# Patient Record
Sex: Male | Born: 1970 | Race: Black or African American | Hispanic: No | Marital: Married | Smoking: Never smoker
Health system: Southern US, Community
[De-identification: ages and names within clinical notes are randomized; demographics above are authoritative.]

## PROBLEM LIST (undated history)

## (undated) DIAGNOSIS — I10 Essential (primary) hypertension: Secondary | ICD-10-CM

## (undated) DIAGNOSIS — E669 Obesity, unspecified: Secondary | ICD-10-CM

## (undated) HISTORY — PX: TOOTH EXTRACTION: SUR596

---

## 1998-06-10 ENCOUNTER — Emergency Department (HOSPITAL_COMMUNITY): Admission: EM | Admit: 1998-06-10 | Discharge: 1998-06-10 | Payer: Self-pay

## 1998-06-16 ENCOUNTER — Emergency Department (HOSPITAL_COMMUNITY): Admission: EM | Admit: 1998-06-16 | Discharge: 1998-06-16 | Payer: Self-pay | Admitting: Emergency Medicine

## 1999-07-01 ENCOUNTER — Emergency Department (HOSPITAL_COMMUNITY): Admission: EM | Admit: 1999-07-01 | Discharge: 1999-07-01 | Payer: Self-pay | Admitting: Emergency Medicine

## 1999-10-31 ENCOUNTER — Emergency Department (HOSPITAL_COMMUNITY): Admission: EM | Admit: 1999-10-31 | Discharge: 1999-11-01 | Payer: Self-pay

## 1999-11-01 ENCOUNTER — Encounter: Payer: Self-pay | Admitting: Emergency Medicine

## 2001-02-17 ENCOUNTER — Emergency Department (HOSPITAL_COMMUNITY): Admission: EM | Admit: 2001-02-17 | Discharge: 2001-02-17 | Payer: Self-pay | Admitting: Emergency Medicine

## 2001-02-25 ENCOUNTER — Emergency Department (HOSPITAL_COMMUNITY): Admission: EM | Admit: 2001-02-25 | Discharge: 2001-02-26 | Payer: Self-pay | Admitting: Emergency Medicine

## 2001-06-29 ENCOUNTER — Emergency Department (HOSPITAL_COMMUNITY): Admission: EM | Admit: 2001-06-29 | Discharge: 2001-06-29 | Payer: Self-pay

## 2001-10-15 ENCOUNTER — Emergency Department (HOSPITAL_COMMUNITY): Admission: EM | Admit: 2001-10-15 | Discharge: 2001-10-15 | Payer: Self-pay | Admitting: Emergency Medicine

## 2003-10-31 ENCOUNTER — Emergency Department (HOSPITAL_COMMUNITY): Admission: EM | Admit: 2003-10-31 | Discharge: 2003-10-31 | Payer: Self-pay | Admitting: Emergency Medicine

## 2004-05-17 ENCOUNTER — Emergency Department (HOSPITAL_COMMUNITY): Admission: EM | Admit: 2004-05-17 | Discharge: 2004-05-17 | Payer: Self-pay | Admitting: *Deleted

## 2005-03-30 ENCOUNTER — Ambulatory Visit: Payer: Self-pay | Admitting: Gastroenterology

## 2005-03-31 ENCOUNTER — Ambulatory Visit: Payer: Self-pay | Admitting: Gastroenterology

## 2006-12-29 ENCOUNTER — Emergency Department (HOSPITAL_COMMUNITY): Admission: EM | Admit: 2006-12-29 | Discharge: 2006-12-29 | Payer: Self-pay | Admitting: Emergency Medicine

## 2007-08-15 ENCOUNTER — Emergency Department (HOSPITAL_COMMUNITY): Admission: EM | Admit: 2007-08-15 | Discharge: 2007-08-15 | Payer: Self-pay | Admitting: Emergency Medicine

## 2008-07-19 ENCOUNTER — Observation Stay (HOSPITAL_COMMUNITY): Admission: EM | Admit: 2008-07-19 | Discharge: 2008-07-20 | Payer: Self-pay | Admitting: Family Medicine

## 2008-08-06 ENCOUNTER — Observation Stay (HOSPITAL_COMMUNITY): Admission: EM | Admit: 2008-08-06 | Discharge: 2008-08-07 | Payer: Self-pay | Admitting: Emergency Medicine

## 2010-07-30 ENCOUNTER — Other Ambulatory Visit: Payer: Self-pay | Admitting: Family Medicine

## 2010-08-04 ENCOUNTER — Ambulatory Visit
Admission: RE | Admit: 2010-08-04 | Discharge: 2010-08-04 | Disposition: A | Discharge status: Home / Self Care | Payer: No Typology Code available for payment source | Source: Ambulatory Visit | Attending: Family Medicine | Admitting: Family Medicine

## 2010-08-04 DIAGNOSIS — R519 Headache, unspecified: Secondary | ICD-10-CM

## 2010-09-17 LAB — LIPID PANEL
HDL: 34 mg/dL — ABNORMAL LOW (ref >39)
LDL Cholesterol: 99 mg/dL (ref 0–99)
Total CHOL/HDL Ratio: 4.6 RATIO
Triglycerides: 121 mg/dL (ref <150)
VLDL: 24 mg/dL (ref 0–40)

## 2010-09-17 LAB — POCT I-STAT, CHEM 8
BUN: 7 mg/dL (ref 6–23)
Chloride: 104 mEq/L (ref 96–112)
Glucose, Bld: 96 mg/dL (ref 70–99)
HCT: 45 % (ref 39.0–52.0)
Potassium: 3.8 mEq/L (ref 3.5–5.1)

## 2010-09-17 LAB — BASIC METABOLIC PANEL
BUN: 7 mg/dL (ref 6–23)
Chloride: 103 mEq/L (ref 96–112)
Creatinine, Ser: 0.84 mg/dL (ref 0.4–1.5)

## 2010-09-17 LAB — POCT CARDIAC MARKERS
CKMB, poc: <1 ng/mL — ABNORMAL LOW (ref 1.0–8.0)
Troponin i, poc: <0.05 ng/mL (ref 0.00–0.09)

## 2010-09-17 LAB — COMPREHENSIVE METABOLIC PANEL
ALT: 17 U/L (ref 0–53)
AST: 21 U/L (ref 0–37)
Albumin: 3.8 g/dL (ref 3.5–5.2)
Alkaline Phosphatase: 70 U/L (ref 39–117)
Potassium: 3.8 mEq/L (ref 3.5–5.1)
Sodium: 139 mEq/L (ref 135–145)
Total Protein: 7.2 g/dL (ref 6.0–8.3)

## 2010-09-17 LAB — DIFFERENTIAL
Basophils Relative: 1 % (ref 0–1)
Eosinophils Absolute: 0.1 10*3/uL (ref 0.0–0.7)
Monocytes Absolute: 0.4 10*3/uL (ref 0.1–1.0)
Monocytes Relative: 7 % (ref 3–12)

## 2010-09-17 LAB — CBC
MCHC: 33.6 g/dL (ref 30.0–36.0)
MCV: 80.5 fL (ref 78.0–100.0)
Platelets: 316 10*3/uL (ref 150–400)
Platelets: 318 10*3/uL (ref 150–400)
RDW: 14.8 % (ref 11.5–15.5)
RDW: 14.9 % (ref 11.5–15.5)
WBC: 6.7 10*3/uL (ref 4.0–10.5)

## 2010-09-17 LAB — HEPARIN LEVEL (UNFRACTIONATED): Heparin Unfractionated: 0.33 IU/mL (ref 0.30–0.70)

## 2010-09-17 LAB — PROTIME-INR
Prothrombin Time: 13.5 seconds (ref 11.6–15.2)
Prothrombin Time: 13.8 seconds (ref 11.6–15.2)

## 2010-09-17 LAB — CARDIAC PANEL(CRET KIN+CKTOT+MB+TROPI)
Total CK: 138 U/L (ref 7–232)
Troponin I: <0.01 ng/mL (ref 0.00–0.06)

## 2010-09-17 LAB — HEMOGLOBIN A1C: Hgb A1c MFr Bld: 5.8 % (ref 4.6–6.1)

## 2010-09-22 LAB — DIFFERENTIAL
Basophils Absolute: 0.1 10*3/uL (ref 0.0–0.1)
Basophils Relative: 2 % — ABNORMAL HIGH (ref 0–1)
Eosinophils Relative: 7 % — ABNORMAL HIGH (ref 0–5)
Monocytes Absolute: 0.5 10*3/uL (ref 0.1–1.0)
Monocytes Relative: 7 % (ref 3–12)

## 2010-09-22 LAB — BASIC METABOLIC PANEL
BUN: 9 mg/dL (ref 6–23)
Creatinine, Ser: 0.83 mg/dL (ref 0.4–1.5)
GFR calc non Af Amer: >60 mL/min (ref >60)
Potassium: 3.8 mEq/L (ref 3.5–5.1)

## 2010-09-22 LAB — POCT CARDIAC MARKERS: Troponin i, poc: <0.05 ng/mL (ref 0.00–0.09)

## 2010-09-22 LAB — COMPREHENSIVE METABOLIC PANEL
BUN: 10 mg/dL (ref 6–23)
CO2: 28 mEq/L (ref 19–32)
Chloride: 103 mEq/L (ref 96–112)
Creatinine, Ser: 0.88 mg/dL (ref 0.4–1.5)
GFR calc non Af Amer: >60 mL/min (ref >60)
Glucose, Bld: 102 mg/dL — ABNORMAL HIGH (ref 70–99)
Total Bilirubin: 0.6 mg/dL (ref 0.3–1.2)

## 2010-09-22 LAB — CARDIAC PANEL(CRET KIN+CKTOT+MB+TROPI)
CK, MB: 0.7 ng/mL (ref 0.3–4.0)
Relative Index: 0.6 (ref 0.0–2.5)
Relative Index: 0.6 (ref 0.0–2.5)
Relative Index: 0.6 (ref 0.0–2.5)
Troponin I: <0.01 ng/mL (ref 0.00–0.06)

## 2010-09-22 LAB — CBC
HCT: 44 % (ref 39.0–52.0)
Hemoglobin: 14.7 g/dL (ref 13.0–17.0)
MCHC: 33.5 g/dL (ref 30.0–36.0)
MCV: 81.4 fL (ref 78.0–100.0)
RDW: 14.1 % (ref 11.5–15.5)

## 2010-09-22 LAB — D-DIMER, QUANTITATIVE: D-Dimer, Quant: <0.22 ug/mL-FEU (ref 0.00–0.48)

## 2010-09-22 LAB — LIPID PANEL: Triglycerides: 124 mg/dL (ref <150)

## 2010-09-22 LAB — TSH: TSH: 0.742 u[IU]/mL (ref 0.350–4.500)

## 2010-09-22 LAB — LIPASE, BLOOD: Lipase: 24 U/L (ref 11–59)

## 2010-10-20 NOTE — Cardiovascular Report (Signed)
Alan Nguyen, Alan Nguyen              ACCOUNT NO.:  [id number]   MEDICAL RECORD NO.:  [id number]          PATIENT TYPE:  INP   LOCATION:  2855                         FACILITY:  [facility]   PHYSICIAN:  Thereasa Solo. Little, M.D. DATE OF BIRTH:  1971-04-30   DATE OF PROCEDURE:  08/07/2008  DATE OF DISCHARGE:  08/07/2008                            CARDIAC CATHETERIZATION   This 40 year old male was admitted on August 06, 2008, with chest pain and  shortness of breath.  He had had an exercise treadmill test that was  unremarkable and that he could not finish the test.  It was a very  little value.  He had been planned to have an outpatient cath, but  because of this admission, the decision was made to proceed on with cath  today.   After obtaining informed consent, the patient was prepped and draped in  the usual sterile fashion exposing the right groin.  Following local  anesthetic with 1% Xylocaine, the Seldinger technique employed, and a 5-  [place] introducer sheath was placed in the right femoral artery and 8-  French introducer sheath in the right femoral vein.  A Swan-Ganz  catheter was then advanced through its normal route into the pulmonary  artery.  It was very difficult to advance the Swan-Ganz catheter into  the pulmonary artery.  I had to use a Swan wire.  Despite positioning it  in the pulmonary artery and using a Swan wire, the catheter continued to  loop in the IVC, and I could never advance the catheter into the wedge  position.  I pulled the Swan-Ganz catheter back into the right  ventricle, tried to straighten out the loop, and then was at that point  unable to advance the catheter across the pulmonic valve again.  At that  point, the right heart catheterization was terminated after hemodynamics  were undertaken.   Coronary arteriography and ventriculography was also performed.   COMPLICATIONS:  None.   TOTAL CONTRAST:  75 mL.   EQUIPMENT:  5-French Judkins configuration  catheters.   RESULTS:  1. Right atrial pressure 12, right ventricular pressure 25/5,      pulmonary artery pressure 20/9.  Central aortic pressure was 128/83      and LV pressure was 131/9 with no significant gradient noted at the      time of pullback.  2. Ventriculography.  Ventriculography in the RAO projection done at      the end of the procedure using 25 mL of contrast at 12 mL per      second was associated with marked ventricular ectopy.  His ejection      fraction was grossly normal.  His end-diastolic pressure was 16.  3. Coronary arteriography:  There was no calcification seen in the      distribution of the coronary arteries on fluoroscopy.      a.     Normal.  It trifurcated.      b.     Circumflex.  The circumflex gave rise to 2 OM vessels, both       of which were free of  disease as was the circumflex.      c.     Optional diagonal, medium-sized vessel free of disease.      d.     LAD.  The LAD extended down across the apex of the heart.       It was free of disease.  There was a moderate-sized first diagonal       that was also free of disease.      e.     Right coronary artery.  The right coronary was free of       disease.  The PDA itself was relatively small, but free of       disease.   CONCLUSION:  1. No occlusive coronary artery disease.  2. Normal left ventricular systolic function.  3. Normal pulmonary artery pressures.   I cannot explain his chest pain or shortness of breath from the above  cath data.  I plan to discharge him to home later today.  He can follow  up with Dr. Jacinto Halim in the office.           ______________________________  Thereasa Solo. Little, M.D.     ABL/MEDQ  D:  08/07/2008  T:  08/08/2008  Job:  [id number]

## 2010-10-20 NOTE — Discharge Summary (Signed)
Alan Nguyen, Alan Nguyen              ACCOUNT NO.:  [id number]   MEDICAL RECORD NO.:  [id number]          PATIENT TYPE:  INP   LOCATION:  2855                         FACILITY:  [facility]   PHYSICIAN:  Cristy Hilts. Jacinto Halim, MD       DATE OF BIRTH:  Jan 15, 1971   DATE OF ADMISSION:  08/06/2008  DATE OF DISCHARGE:  08/07/2008                               DISCHARGE SUMMARY   DISCHARGE DIAGNOSES:  Chest pain, noncardiac with normal coronaries, and  normal left ventricular function.   HOSPITAL COURSE:  The patient is a 40 year old male who presented on  August 06, 2008, with chest pain.  He recently had been admitted for chest  pain and MI was ruled out.  He was supposed to follow up in the office  with Dr. Jacinto Halim.  He had a Myoview at the office, but only lasted about 3  minutes on the treadmill before he had chest pain and shortness of  breath.  He was scheduled to have an outpatient catheterization on the  Friday after admission, but presented early with chest pain.  He was  admitted from the emergency room started on IV heparin and nitrates.  His labs are within normal limits, his troponins are negative.  His D-  dimer is less than 0.22.  His LDL was 93, HDL 26.  He was set up for  diagnostic catheterization, which was done by Dr. Clarene Duke on August 07, 2008.  This showed no significant coronary artery disease.  The patient  will be discharged home later on the third.   DISCHARGE MEDICATIONS:  Advil p.r.n.   LABORATORY DATA:  Creatinine 0.9, hemoglobin 15.3, hematocrit 45, D-  dimer is less than 0.22, cholesterol is 144, HDL 26, LDL 93, tiopronin  is negative.  CK and MB are negative.  EKG shows sinus rhythm without  acute changes.   DISPOSITION:  The patient is discharged in stable condition and he will  follow up with Dr. Jacinto Halim as an outpatient.    Alan Nguyen has been instructed not to return to work until at least  Monday, August 12, 2008.  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      [name],  P.A.      Cristy Hilts. Jacinto Halim, MD  Electronically Signed    LKK/MEDQ  D:  08/07/2008  T:  08/08/2008  Job:  [id number]

## 2010-10-20 NOTE — H&P (Signed)
NAMEPRUDENCIO, VELAZCO NO.:  [id number]   MEDICAL RECORD NO.:  [id number]          PATIENT TYPE:  INP   LOCATION:  3735                         FACILITY:  [facility]   PHYSICIAN:  Eduard Clos, MDDATE OF BIRTH:  [birth year]   DATE OF ADMISSION:  07/18/2008  DATE OF DISCHARGE:                              HISTORY & PHYSICAL   PRIMARY CARE PHYSICIAN:  L. Lupe Carney, M.D.   CHIEF COMPLAINT:  Chest pain.   HISTORY OF PRESENT ILLNESS:  A 40 year old male with no significant past  medical history who presented to ER complaining of increasing chest pain  over the last 3 days.  The patient stated the chest pain is pressure  like, nonradiating, retrosternal, constant, sometimes increased by  exertion.  Denies any associated palpitation, nausea, diaphoresis or  shortness of breath.  The patient has been admitted for further  observation.  So far cardiac enzymes have been negative.  Presently, the  patient stated the chest pain is largely improved after getting  nitroglycerin sublingual.  The patient denies any dizziness, loss of  consciousness, weakness of limbs, abdominal pain, nausea, vomiting,  diarrhea, dysuria, discharge, fever or chills or headache.   PAST MEDICAL HISTORY:  Nothing significant.   PAST SURGICAL HISTORY:  None.   MEDICATIONS ON ADMISSION:  None.   ALLERGIES:  No known drug allergies.   SOCIAL HISTORY:  The patient denies smoking cigarettes, drinks alcohol  occasionally.  Denies any illegal drug abuse.   FAMILY HISTORY:  Not significant for any coronary disease or stroke or  diabetes.   REVIEW OF SYSTEMS:  As presented in the history of present illness.  Nothing else significant.   PHYSICAL EXAMINATION:  Patient examined at bedside, not in acute  distress.  VITAL SIGNS:  Blood pressure is 97/59, pulse 60 per minute, temperature  97.8, respirations 18 per minute, O2 sat 98%.  HEENT:  Anicteric.  No pallor.  CHEST:  Bilateral air entry  present.  No rhonchi, no crepitation.  HEART:  S1, S2 heard.  ABDOMEN:  Soft, nontender.  Bowel sounds heard.  CNS:  Alert.  Oriented to time, place and person.  Both upper and lower  extremities 5/5.  EXTREMITIES:  Peripheral pulses felt.  No edema.   LABS:  EKG:  Normal sinus rhythm with nonspecific T-wave changes.  Chest  x-ray:  No active lung disease, mild peribronchial thickening.  CBC:  WBC is 6.2, hemoglobin 14.7, hematocrit 44, platelets 329, D-dimer less  than 0.2.  Complete metabolic panel:  Sodium 136, potassium 3.7,  chloride 103, carbon dioxide 28, glucose 102, BUN 10, creatinine 0.8,  total bilirubin 0.6, alkaline phosphatase 64, AST 16, ALT 17, total  protein 7.5, albumin 3.8, calcium 9, lipase 24, troponin I less than  0.01 x2, CK 123 and 119, myoglobin 49.8 and 47.   ASSESSMENT:  Chest pain, rule out acute coronary syndrome.   PLAN:  To admit the patient to telemetry for observation.  Will cycle  one more set of cardiac enzymes.  Will get a cardiological opinion and  we will follow the recommendations.  Eduard Clos, MD  Electronically Signed     ANK/MEDQ  D:  07/19/2008  T:  07/19/2008  Job:  [id number]

## 2010-10-20 NOTE — Consult Note (Signed)
Alan Nguyen, Alan Nguyen              ACCOUNT NO.:  [id number]   MEDICAL RECORD NO.:  [id number]          PATIENT TYPE:  INP   LOCATION:  3735                         FACILITY:  [facility]   PHYSICIAN:  Cristy Hilts. Jacinto Halim, MD       DATE OF BIRTH:  11/14/1970   DATE OF CONSULTATION:  07/19/2008  DATE OF DISCHARGE:                                 CONSULTATION   CHIEF COMPLAINT:  Chest pain and shortness of breath.   HISTORY OF PRESENT ILLNESS:  Mr. Levario is a 40 year old male who was  admitted by the [facility] on July 19, 2008 with chest pain,  shortness of breath.  The patient says he has had constant left-sided  chest pain with some dyspnea on exertion since Tuesday.  He describes it  as someone sitting on my chest.  It does not radiate to his arm or  jaws.  He does feel like it is worse when he walks, it is also tender.  He has never had chest pain like this before.  He did take some Tylenol  at home with partial relief.   CURRENT MEDICATIONS:  None.   PAST MEDICAL HISTORY:  Is unremarkable for serious medical problems, he  denies hypertension, diabetes or dyslipidemia.  He has not had major  surgeries.  He has no known drug allergies.   SOCIAL HISTORY:  He is separated, he works at [employer].  He does lift heavy  boxes, but he has not done this any more than usual recently.  He denies  drug use.   FAMILY HISTORY:  Unremarkable for coronary disease or diabetes.   REVIEW OF SYSTEMS:  Essentially unremarkable except for as noted above.  He denies any cough or fever.  He has not had lower extremity edema.   PHYSICAL EXAM:  97/59, pulse 60, temp 97.8, O2 sat 98 on 2 liters.   Troponins are negative x3.  Sodium 136, potassium 3.7, BUN 10,  creatinine 0.88.  LFTs were normal.  White count 6.2, hemoglobin 14.7,  hematocrit 44, platelets 329.  Chest x-ray shows no active lung disease.  EKG shows sinus rhythm without acute changes, sinus bradycardia.  D-  dimer was normal at 0.22 or  less.   IMPRESSION:  Chest pain, this sounds musculoskeletal.   PLAN:  The patient was seen by a cardiologist today.  He may need to be  set up for an outpatient stress test.      [name], P.A.      Cristy Hilts. Jacinto Halim, MD  Electronically Signed    LKK/MEDQ  D:  07/19/2008  T:  07/19/2008  Job:  [id number]

## 2011-03-01 LAB — CBC
HCT: 41.2
Hemoglobin: 13.7
RBC: 5.12
WBC: 6.4

## 2011-03-01 LAB — URINALYSIS, ROUTINE W REFLEX MICROSCOPIC
Glucose, UA: NEGATIVE
Specific Gravity, Urine: 1.021
pH: 5.5

## 2011-03-01 LAB — COMPREHENSIVE METABOLIC PANEL
ALT: 12
Alkaline Phosphatase: 62
BUN: 10
Chloride: 102
Glucose, Bld: 121 — ABNORMAL HIGH
Potassium: 3.6
Total Bilirubin: 0.6

## 2011-03-01 LAB — GC/CHLAMYDIA PROBE AMP, GENITAL: Chlamydia, DNA Probe: NEGATIVE

## 2011-03-01 LAB — DIFFERENTIAL
Basophils Absolute: 0.1
Basophils Relative: 1
Eosinophils Absolute: 0.3
Monocytes Absolute: 0.5
Neutro Abs: 3.5
Neutrophils Relative %: 55

## 2011-09-09 ENCOUNTER — Encounter (HOSPITAL_COMMUNITY): Payer: Self-pay | Admitting: Emergency Medicine

## 2011-09-09 ENCOUNTER — Emergency Department (INDEPENDENT_AMBULATORY_CARE_PROVIDER_SITE_OTHER)
Admission: EM | Admit: 2011-09-09 | Discharge: 2011-09-09 | Disposition: A | Discharge status: Home / Self Care | Payer: BC Managed Care – PPO | Source: Home / Self Care | Attending: Family Medicine | Admitting: Family Medicine

## 2011-09-09 ENCOUNTER — Telehealth (HOSPITAL_COMMUNITY): Payer: Self-pay | Admitting: *Deleted

## 2011-09-09 DIAGNOSIS — J029 Acute pharyngitis, unspecified: Secondary | ICD-10-CM

## 2011-09-09 MED ORDER — AMOXICILLIN 500 MG PO CAPS: 500.0000 mg | ORAL_CAPSULE | Freq: Three times a day (TID) | ORAL | Status: AC

## 2011-09-09 NOTE — ED Notes (Signed)
PT HERE WITH SORE THROAT THAT STARTED Monday BUT HAS WORSENED WITH DIFF SWALLOWING AND PAIN.NO FEVERS OR N/V.PT TRIED OTC THROAT SPRAY BUT NO RELIEF

## 2011-09-09 NOTE — Discharge Instructions (Signed)
Tylenol or motrin as needed. Avoid caffeine and milk products. Throat spray of choice. Pharyngitis, Viral and Bacterial Pharyngitis is soreness (inflammation) or infection of the pharynx. It is also called a sore throat. CAUSES  Most sore throats are caused by viruses and are part of a cold. However, some sore throats are caused by strep and other bacteria. Sore throats can also be caused by post nasal drip from draining sinuses, allergies and sometimes from sleeping with an open mouth. Infectious sore throats can be spread from person to person by coughing, sneezing and sharing cups or eating utensils. TREATMENT  Sore throats that are viral usually last 3-4 days. Viral illness will get better without medications (antibiotics). Strep throat and other bacterial infections will usually begin to get better about 24-48 hours after you begin to take antibiotics. HOME CARE INSTRUCTIONS   If the caregiver feels there is a bacterial infection or if there is a positive strep test, they will prescribe an antibiotic. The full course of antibiotics must be taken. If the full course of antibiotic is not taken, you or your child may become ill again. If you or your child has strep throat and do not finish all of the medication, serious heart or kidney diseases may develop.   Drink enough water and fluids to keep your urine clear or pale yellow.   Only take over-the-counter or prescription medicines for pain, discomfort or fever as directed by your caregiver.   Get lots of rest.   Gargle with salt water ( tsp. of salt in a glass of water) as often as every 1-2 hours as you need for comfort.   Hard candies may soothe the throat if individual is not at risk for choking. Throat sprays or lozenges may also be used.  SEEK MEDICAL CARE IF:   Large, tender lumps in the neck develop.   A rash develops.   Green, yellow-brown or bloody sputum is coughed up.   Your baby is older than 3 months with a rectal  temperature of 100.5 F (38.1 C) or higher for more than 1 day.  SEEK IMMEDIATE MEDICAL CARE IF:   A stiff neck develops.   You or your child are drooling or unable to swallow liquids.   You or your child are vomiting, unable to keep medications or liquids down.   You or your child has severe pain, unrelieved with recommended medications.   You or your child are having difficulty breathing (not due to stuffy nose).   You or your child are unable to fully open your mouth.   You or your child develop redness, swelling, or severe pain anywhere on the neck.   You have a fever.   Your baby is older than 3 months with a rectal temperature of 102 F (38.9 C) or higher.   Your baby is 70 months old or younger with a rectal temperature of 100.4 F (38 C) or higher.  MAKE SURE YOU:   Understand these instructions.   Will watch your condition.   Will get help right away if you are not doing well or get worse.  Document Released: 05/24/2005 Document Revised: 05/13/2011 Document Reviewed: 08/21/2007 [facility] Patient Information 2012 [place], [place].

## 2011-09-09 NOTE — ED Notes (Signed)
1617 Pt. called and said he forgot to ask for a work note. I told him Dr. Lorenza Chick has already left and I would have to ask one of the other doctors. I told him he would have to come pick it up at the front desk. He voiced understanding. Discussed with Dr. Lorenz Coaster and he said pt. can go back on Sat. 4/6. Note done as directed and put at the front desk.  I called and left message with pt.'s brother that note was ready to be picked up. Vassie Moselle 09/09/2011

## 2011-09-09 NOTE — ED Provider Notes (Signed)
History     CSN: [id number]  Arrival date & time 09/09/11  1357   First MD Initiated Contact with Patient 09/09/11 1531      Chief Complaint  Patient presents with  . Sore Throat    (Consider location/radiation/quality/duration/timing/severity/associated sxs/prior treatment) HPI Comments: The patient present with a history of sore throat x 3 days. Getting worse. Using otc thorat spray. He denies fever, runny nose, cough or congestion. Has noted some right ear pain with swallowing. No vomiting or diarrhea  The history is provided by the patient.    History reviewed. No pertinent past medical history.  Past Surgical History  Procedure Date  . Tooth extraction     History reviewed. No pertinent family history.  History  Substance Use Topics  . Smoking status: Never Smoker   . Smokeless tobacco: Not on file  . Alcohol Use: Yes      Review of Systems  Constitutional: Negative.   HENT: Positive for ear pain, sore throat and trouble swallowing. Negative for congestion, rhinorrhea, neck stiffness and postnasal drip.   Respiratory: Negative for cough, choking and shortness of breath.   Cardiovascular: Negative.   Gastrointestinal: Negative.   Genitourinary: Negative.   Musculoskeletal: Negative.     Allergies  Review of patient's allergies indicates no known allergies.  Home Medications   Current Outpatient Rx  Name Route Sig Dispense Refill  . AMOXICILLIN 500 MG PO CAPS Oral Take 1 capsule (500 mg total) by mouth 3 (three) times daily. 30 capsule 0    BP 123/74  Pulse 80  Temp(Src) 98.4 F (36.9 C) (Oral)  Resp 18  SpO2 98%  Physical Exam  Nursing note and vitals reviewed. Constitutional: He appears well-developed and well-nourished. No distress.  HENT:  Head: Normocephalic and atraumatic.       Ears clear, nose clear, throat red and swollen without exudates  Neck: Normal range of motion. Neck supple. No tracheal deviation present.  Cardiovascular:  Normal rate and regular rhythm.   Pulmonary/Chest: Effort normal and breath sounds normal.  Lymphadenopathy:    He has no cervical adenopathy.  Skin: Skin is warm and dry.    ED Course  Procedures (including critical care time)  Labs Reviewed - No data to display No results found.   1. Pharyngitis       MDM         Randa Spike, MD 09/09/11 [phone]

## 2013-04-07 ENCOUNTER — Emergency Department: Payer: Self-pay | Admitting: Emergency Medicine

## 2015-06-04 ENCOUNTER — Emergency Department
Admission: EM | Admit: 2015-06-04 | Discharge: 2015-06-04 | Disposition: A | Discharge status: Home / Self Care | Payer: Worker's Compensation | Attending: Emergency Medicine | Admitting: Emergency Medicine

## 2015-06-04 ENCOUNTER — Encounter: Payer: Self-pay | Admitting: Emergency Medicine

## 2015-06-04 DIAGNOSIS — S51011A Laceration without foreign body of right elbow, initial encounter: Secondary | ICD-10-CM | POA: Diagnosis not present

## 2015-06-04 DIAGNOSIS — W268XXA Contact with other sharp object(s), not elsewhere classified, initial encounter: Secondary | ICD-10-CM | POA: Insufficient documentation

## 2015-06-04 DIAGNOSIS — Y9289 Other specified places as the place of occurrence of the external cause: Secondary | ICD-10-CM | POA: Diagnosis not present

## 2015-06-04 DIAGNOSIS — S41111A Laceration without foreign body of right upper arm, initial encounter: Secondary | ICD-10-CM | POA: Diagnosis present

## 2015-06-04 DIAGNOSIS — Z23 Encounter for immunization: Secondary | ICD-10-CM | POA: Diagnosis not present

## 2015-06-04 DIAGNOSIS — Y990 Civilian activity done for income or pay: Secondary | ICD-10-CM | POA: Diagnosis not present

## 2015-06-04 DIAGNOSIS — Y9389 Activity, other specified: Secondary | ICD-10-CM | POA: Insufficient documentation

## 2015-06-04 MED ORDER — LIDOCAINE-EPINEPHRINE (PF) 1 %-1:200000 IJ SOLN
INTRAMUSCULAR | Status: AC
  Filled 2015-06-04: qty 30

## 2015-06-04 MED ORDER — OXYCODONE-ACETAMINOPHEN 5-325 MG PO TABS
1.0000 | ORAL_TABLET | Freq: Once | ORAL | Status: AC
  Administered 2015-06-04: 1 via ORAL
  Filled 2015-06-04: qty 1

## 2015-06-04 MED ORDER — TETANUS-DIPHTHERIA TOXOIDS TD 5-2 LFU IM INJ: 0.5000 mL | INJECTION | Freq: Once | INTRAMUSCULAR | Status: DC

## 2015-06-04 MED ORDER — TRAMADOL HCL 50 MG PO TABS: 50.0000 mg | ORAL_TABLET | Freq: Four times a day (QID) | ORAL | Status: AC | PRN

## 2015-06-04 MED ORDER — TETANUS-DIPHTH-ACELL PERTUSSIS 5-2.5-18.5 LF-MCG/0.5 IM SUSP
INTRAMUSCULAR | Status: AC
  Administered 2015-06-04: 0.5 mL via INTRAMUSCULAR
  Filled 2015-06-04: qty 0.5

## 2015-06-04 NOTE — ED Notes (Signed)
Brought in via ems from work  Laceration noted to right arm from a door panel

## 2015-06-04 NOTE — ED Provider Notes (Signed)
[facility] Emergency Department Provider Note  ____________________________________________  Time seen: Approximately 2:13 PM  I have reviewed the triage vital signs and the nursing notes.   HISTORY  Chief Complaint Extremity Laceration    HPI Alan Nguyen is a 44 y.o. male patient arrived via EMS from a job-related injury. Patient has a laceration secondary to a metal cut to the posterior right arm extending into the elbow. Hemorrhaging was finally controlled with direct pressure by EMS. Patient denies any loss of sensation or loss of function of the affected extremity. Patient state he did not remember when his last tetanus shot was given. Patient does state there is no pain. Except for her seizures and no palliative measures performed prior to arrival. Patient is right-hand dominant.  History reviewed. No pertinent past medical history.  There are no active problems to display for this patient.   Past Surgical History  Procedure Laterality Date  . Tooth extraction      Current Outpatient Rx  Name  Route  Sig  Dispense  Refill  . traMADol (ULTRAM) 50 MG tablet   Oral   Take 1 tablet (50 mg total) by mouth every 6 (six) hours as needed.   20 tablet   0     Allergies Review of patient's allergies indicates no known allergies.  No family history on file.  Social History Social History  Substance Use Topics  . Smoking status: Never Smoker   . Smokeless tobacco: None  . Alcohol Use: Yes    Review of Systems Constitutional: No fever/chills Eyes: No visual changes. ENT: No sore throat. Cardiovascular: Denies chest pain. Respiratory: Denies shortness of breath. Gastrointestinal: No abdominal pain.  No nausea, no vomiting.  No diarrhea.  No constipation. Genitourinary: Negative for dysuria. Musculoskeletal: Negative for back pain. Skin: Laceration to right arm extending to the elbow. Neurological: Negative for headaches, focal  weakness or numbness. 10-point ROS otherwise negative.  ____________________________________________   PHYSICAL EXAM:  VITAL SIGNS: ED Triage Vitals  Enc Vitals Group     BP 06/04/15 1237 137/100 mmHg     Pulse Rate 06/04/15 1237 78     Resp 06/04/15 1237 18     Temp 06/04/15 1237 98 F (36.7 C)     Temp Source 06/04/15 1237 Oral     SpO2 06/04/15 1237 98 %     Weight 06/04/15 1237 250 lb (113.399 kg)     Height 06/04/15 1237 6' (1.829 m)     Head Cir --      Peak Flow --      Pain Score 06/04/15 1235 0     Pain Loc --      Pain Edu? --      Excl. in GC? --     Constitutional: Alert and oriented. Well appearing and in no acute distress. Eyes: Conjunctivae are normal. PERRL. EOMI. Head: Atraumatic. Nose: No congestion/rhinnorhea. Mouth/Throat: Mucous membranes are moist.  Oropharynx non-erythematous. Neck: No stridor.  No cervical spine tenderness to palpation. Hematological/Lymphatic/Immunilogical: No cervical lymphadenopathy. Cardiovascular: Normal rate, regular rhythm. Grossly normal heart sounds.  Good peripheral circulation. Respiratory: Normal respiratory effort.  No retractions. Lungs CTAB. Gastrointestinal: Soft and nontender. No distention. No abdominal bruits. No CVA tenderness. Musculoskeletal: No lower extremity tenderness nor edema.  No joint effusions. Neurologic:  Normal speech and language. No gross focal neurologic deficits are appreciated. No gait instability. Skin:  Skin is warm, dry and intact. No rash noted. 5 cm laceration extending from the  lower right arm into the elbow. Copious amount of venous hemorrhaging. Psychiatric: Mood and affect are normal. Speech and behavior are normal.  ____________________________________________   LABS (all labs ordered are listed, but only abnormal results are displayed)  Labs Reviewed - No data to  display ____________________________________________  EKG   ____________________________________________  RADIOLOGY   ____________________________________________   PROCEDURES  Procedure(s) performed: None  Critical Care performed: No LACERATION REPAIR Performed by: Joni Reiningonald K Smith Authorized by: Joni Reiningonald K Smith Consent: Verbal consent obtained. Risks and benefits: risks, benefits and alternatives were discussed Consent given by: patient Patient identity confirmed: provided demographic data Prepped and Draped in normal sterile fashion Wound explored  Laceration Location: Right arm extending to the right elbow.  Laceration Length: 5 and half centimeters.  No Foreign Bodies seen or palpated  Anesthesia: local infiltration  Local anesthetic: lidocaine 1% with epinephrine. epinephrine  Anesthetic total: Well ML's. ml  Irrigation method: syringe Amount of cleaning: standard  Skin closure: We'll nylon. Number of sutures: 12.   Technique: Interrupted   Patient tolerance: Patient tolerated the procedure well with no immediate complications.  ____________________________________________   INITIAL IMPRESSION / ASSESSMENT AND PLAN / ED COURSE  Pertinent labs & imaging results that were available during my care of the patient were reviewed by me and considered in my medical decision making (see chart for details).   laceration right on the extensor right forearm. Hemorrhage and was finally controlled using Surgicel. Area was sutured, present dressing applied, and placed in a sling.. Patient advised to monitor area for any developing swelling to return back to ER if swelling worsens. Patient also advised to follow-up 20 file for wound recheck. ___________________________________________   FINAL CLINICAL IMPRESSION(S) / ED DIAGNOSES  Final diagnoses:  Laceration of right arm with complication, initial encounter      Joni ReiningRonald K Smith, PA-C 06/04/15 1422  Minna AntisKevin  Paduchowski, MD 06/04/15 [phone]

## 2015-06-04 NOTE — Discharge Instructions (Signed)
Laceration Care, Adult  A laceration is a cut that goes through all layers of the skin. The cut also goes into the tissue that is right under the skin. Some cuts heal on their own. Others need to be closed with stitches (sutures), staples, skin adhesive strips, or wound glue. Taking care of your cut lowers your risk of infection and helps your cut to heal better.  HOW TO TAKE CARE OF YOUR CUT  For stitches or staples:  · Keep the wound clean and dry.  · If you were given a bandage (dressing), you should change it at least one time per day or as told by your doctor. You should also change it if it gets wet or dirty.  · Keep the wound completely dry for the first 24 hours or as told by your doctor. After that time, you may take a shower or a bath. However, make sure that the wound is not soaked in water until after the stitches or staples have been removed.  · Clean the wound one time each day or as told by your doctor:    Wash the wound with soap and water.    Rinse the wound with water until all of the soap comes off.    Pat the wound dry with a clean towel. Do not rub the wound.  · After you clean the wound, put a thin layer of antibiotic ointment on it as told by your doctor. This ointment:    Helps to prevent infection.    Keeps the bandage from sticking to the wound.  · Have your stitches or staples removed as told by your doctor.  If your doctor used skin adhesive strips:   · Keep the wound clean and dry.  · If you were given a bandage, you should change it at least one time per day or as told by your doctor. You should also change it if it gets dirty or wet.  · Do not get the skin adhesive strips wet. You can take a shower or a bath, but be careful to keep the wound dry.  · If the wound gets wet, pat it dry with a clean towel. Do not rub the wound.  · Skin adhesive strips fall off on their own. You can trim the strips as the wound heals. Do not remove any strips that are still stuck to the wound. They will  fall off after a while.  If your doctor used wound glue:  · Try to keep your wound dry, but you may briefly wet it in the shower or bath. Do not soak the wound in water, such as by swimming.  · After you take a shower or a bath, gently pat the wound dry with a clean towel. Do not rub the wound.  · Do not do any activities that will make you really sweaty until the skin glue has fallen off on its own.  · Do not apply liquid, cream, or ointment medicine to your wound while the skin glue is still on.  · If you were given a bandage, you should change it at least one time per day or as told by your doctor. You should also change it if it gets dirty or wet.  · If a bandage is placed over the wound, do not let the tape for the bandage touch the skin glue.  · Do not pick at the glue. The skin glue usually stays on for 5-10 days. Then, it   falls off of the skin.  General Instructions   · To help prevent scarring, make sure to cover your wound with sunscreen whenever you are outside after stitches are removed, after adhesive strips are removed, or when wound glue stays in place and the wound is healed. Make sure to wear a sunscreen of at least 30 SPF.  · Take over-the-counter and prescription medicines only as told by your doctor.  · If you were given antibiotic medicine or ointment, take or apply it as told by your doctor. Do not stop using the antibiotic even if your wound is getting better.  · Do not scratch or pick at the wound.  · Keep all follow-up visits as told by your doctor. This is important.  · Check your wound every day for signs of infection. Watch for:    Redness, swelling, or pain.    Fluid, blood, or pus.  · Raise (elevate) the injured area above the level of your heart while you are sitting or lying down, if possible.  GET HELP IF:  · You got a tetanus shot and you have any of these problems at the injection site:    Swelling.    Very bad pain.    Redness.    Bleeding.  · You have a fever.  · A wound that was  closed breaks open.  · You notice a bad smell coming from your wound or your bandage.  · You notice something coming out of the wound, such as wood or glass.  · Medicine does not help your pain.  · You have more redness, swelling, or pain at the site of your wound.  · You have fluid, blood, or pus coming from your wound.  · You notice a change in the color of your skin near your wound.  · You need to change the bandage often because fluid, blood, or pus is coming from the wound.  · You start to have a new rash.  · You start to have numbness around the wound.  GET HELP RIGHT AWAY IF:  · You have very bad swelling around the wound.  · Your pain suddenly gets worse and is very bad.  · You notice painful lumps near the wound or on skin that is anywhere on your body.  · You have a red streak going away from your wound.  · The wound is on your hand or foot and you cannot move a finger or toe like you usually can.  · The wound is on your hand or foot and you notice that your fingers or toes look pale or bluish.     This information is not intended to replace advice given to you by your health care provider. Make sure you discuss any questions you have with your health care provider.     Document Released: 11/10/2007 Document Revised: 10/08/2014 Document Reviewed: 05/20/2014  Elsevier Interactive Patient Education ©2016 [employer] Inc[employer]

## 2015-06-05 ENCOUNTER — Emergency Department
Admission: EM | Admit: 2015-06-05 | Discharge: 2015-06-05 | Disposition: A | Discharge status: Home / Self Care | Payer: Self-pay | Attending: Emergency Medicine | Admitting: Emergency Medicine

## 2015-06-05 ENCOUNTER — Encounter: Payer: Self-pay | Admitting: Emergency Medicine

## 2015-06-05 DIAGNOSIS — Z5189 Encounter for other specified aftercare: Secondary | ICD-10-CM

## 2015-06-05 DIAGNOSIS — X58XXXD Exposure to other specified factors, subsequent encounter: Secondary | ICD-10-CM | POA: Insufficient documentation

## 2015-06-05 DIAGNOSIS — Z4801 Encounter for change or removal of surgical wound dressing: Secondary | ICD-10-CM | POA: Insufficient documentation

## 2015-06-05 DIAGNOSIS — S51811D Laceration without foreign body of right forearm, subsequent encounter: Secondary | ICD-10-CM | POA: Insufficient documentation

## 2015-06-05 NOTE — ED Notes (Signed)
Assessed per PA 

## 2015-06-05 NOTE — ED Provider Notes (Signed)
[facility] Emergency Department Provider Note  ____________________________________________  Time seen: Approximately 4:33 PM  I have reviewed the triage vital signs and the nursing notes.   HISTORY  Chief Complaint Wound Check    HPI Alan Nguyen is a 44 y.o. male patient here today for wound check secondary to excessive laceration to the posterior aspect of his right arm extending to the elbow. Patient denies any complaint except for mild to moderate pain with extension of the right upper extremity. Patient is rating his pain discomfort as a 7/10. Patient is wearing a sling as directed.   History reviewed. No pertinent past medical history.  There are no active problems to display for this patient.   Past Surgical History  Procedure Laterality Date  . Tooth extraction      Current Outpatient Rx  Name  Route  Sig  Dispense  Refill  . traMADol (ULTRAM) 50 MG tablet   Oral   Take 1 tablet (50 mg total) by mouth every 6 (six) hours as needed.   20 tablet   0     Allergies Review of patient's allergies indicates no known allergies.  No family history on file.  Social History Social History  Substance Use Topics  . Smoking status: Never Smoker   . Smokeless tobacco: None  . Alcohol Use: Yes    Review of Systems Constitutional: No fever/chills Eyes: No visual changes. ENT: No sore throat. Cardiovascular: Denies chest pain. Respiratory: Denies shortness of breath. Gastrointestinal: No abdominal pain.  No nausea, no vomiting.  No diarrhea.  No constipation. Genitourinary: Negative for dysuria. Musculoskeletal: Negative for back pain. Skin: Negative for rash. Right arm laceration. Neurological: Negative for headaches, focal weakness or numbness. 10-point ROS otherwise negative.  ____________________________________________   PHYSICAL EXAM:  VITAL SIGNS: ED Triage Vitals  Enc Vitals Group     BP 06/05/15 1614 143/97  mmHg     Pulse Rate 06/05/15 1614 88     Resp 06/05/15 1614 18     Temp 06/05/15 1614 97.8 F (36.6 C)     Temp Source 06/05/15 1614 Oral     SpO2 06/05/15 1614 94 %     Weight 06/05/15 1614 263 lb (119.296 kg)     Height 06/05/15 1614 6' (1.829 m)     Head Cir --      Peak Flow --      Pain Score 06/05/15 1615 7     Pain Loc --      Pain Edu? --      Excl. in GC? --     Constitutional: Alert and oriented. Well appearing and in no acute distress. Eyes: Conjunctivae are normal. PERRL. EOMI. Head: Atraumatic. Nose: No congestion/rhinnorhea. Mouth/Throat: Mucous membranes are moist.  Oropharynx non-erythematous. Neck: No stridor.  No cervical spine tenderness to palpation. Hematological/Lymphatic/Immunilogical: No cervical lymphadenopathy. Cardiovascular: Normal rate, regular rhythm. Grossly normal heart sounds.  Good peripheral circulation. Respiratory: Normal respiratory effort.  No retractions. Lungs CTAB. Gastrointestinal: Soft and nontender. No distention. No abdominal bruits. No CVA tenderness. Musculoskeletal: Increased pain with extension of the right upper extremity. Neurologic:  Normal speech and language. No gross focal neurologic deficits are appreciated. No gait instability. Skin:  Skin is warm, dry and intact. No rash noted.sutures are intact. No signs symptoms secondary infection.  Psychiatric: Mood and affect are normal. Speech and behavior are normal.  ____________________________________________   LABS (all labs ordered are listed, but only abnormal results are displayed)  Labs Reviewed -  No data to display ____________________________________________  EKG   ____________________________________________  RADIOLOGY   ____________________________________________   PROCEDURES  Procedure(s) performed: None  Critical Care performed: No  ____________________________________________   INITIAL IMPRESSION / ASSESSMENT AND PLAN / ED COURSE  Pertinent  labs & imaging results that were available during my care of the patient were reviewed by me and considered in my medical decision making (see chart for detail  laceration right arm extending into the forearm. This was removed and sutures are intact. No signs symptoms secondary infection. Patient continue to complain of pain with extension of the right upper extremity. Neurovascular intact  FINAL CLINICAL IMPRESSION(S) / ED DIAGNOSES  healing laceration to right arm   Final diagnoses:  Encounter for wound re-check      Joni Reiningonald K Zemira Zehring, PA-C 06/05/15 1641  Phineas SemenGraydon Goodman, MD 06/10/15 [phone]

## 2015-06-05 NOTE — ED Notes (Signed)
Recheck , wound to right elbow  , sling in use , drsg intact, distal pulse present, skin warm to touch ,

## 2015-06-05 NOTE — Discharge Instructions (Signed)
With sling for 3 more days. Remove the sling 3-4 times a day and go through range of motion to extend the arm. Daily dressing changes as directed. Return in 8 days for suture removal.

## 2015-06-13 ENCOUNTER — Encounter: Payer: Self-pay | Admitting: *Deleted

## 2015-06-13 ENCOUNTER — Emergency Department
Admission: EM | Admit: 2015-06-13 | Discharge: 2015-06-13 | Disposition: A | Discharge status: Home / Self Care | Payer: Worker's Compensation | Attending: Student | Admitting: Student

## 2015-06-13 DIAGNOSIS — Z4801 Encounter for change or removal of surgical wound dressing: Secondary | ICD-10-CM | POA: Insufficient documentation

## 2015-06-13 DIAGNOSIS — Z5189 Encounter for other specified aftercare: Secondary | ICD-10-CM

## 2015-06-13 NOTE — Discharge Instructions (Signed)
Return January 12th for suture removal.

## 2015-06-13 NOTE — ED Provider Notes (Signed)
[facility] Emergency Department Provider Note ____________________________________________  Time seen: Approximately 12:46 PM  I have reviewed the triage vital signs and the nursing notes.   HISTORY  Chief Complaint Suture / Staple Removal  HPI Alan Nguyen is a 45 y.o. male is here for suture removal to his right elbow. Patient was sutured on 06/05/2015. He states he has not had any problems other than his elbow is sore. He denies any known fever and has not seen any discharge or drainage.   History reviewed. No pertinent past medical history.  There are no active problems to display for this patient.   Past Surgical History  Procedure Laterality Date  . Tooth extraction      Current Outpatient Rx  Name  Route  Sig  Dispense  Refill  . traMADol (ULTRAM) 50 MG tablet   Oral   Take 1 tablet (50 mg total) by mouth every 6 (six) hours as needed.   20 tablet   0     Allergies Review of patient's allergies indicates no known allergies.  History reviewed. No pertinent family history.  Social History Social History  Substance Use Topics  . Smoking status: Never Smoker   . Smokeless tobacco: None  . Alcohol Use: Yes    Review of Systems Constitutional: No fever/chills Cardiovascular: Denies chest pain. Respiratory: Denies shortness of breath. Gastrointestinal:   No nausea, no vomiting.   Musculoskeletal: Does have right elbow pain. Skin: Negative for rash. Healing laceration right elbow. Neurological: Negative for headaches, focal weakness or numbness.  10-point ROS otherwise negative.  ____________________________________________   PHYSICAL EXAM:  VITAL SIGNS: ED Triage Vitals  Enc Vitals Group     BP 06/13/15 1242 127/99 mmHg     Pulse Rate 06/13/15 1242 74     Resp 06/13/15 1242 18     Temp 06/13/15 1242 97.9 F (36.6 C)     Temp Source 06/13/15 1242 Oral     SpO2 06/13/15 1242 94 %     Weight 06/13/15 1242 257 lb  (116.574 kg)     Height 06/13/15 1242 6' (1.829 m)     Head Cir --      Peak Flow --      Pain Score 06/13/15 1241 5     Pain Loc --      Pain Edu? --      Excl. in GC? --     Constitutional: Alert and oriented. Well appearing and in no acute distress. Eyes: Conjunctivae are normal. PERRL. EOMI. Head: Atraumatic. Nose: No congestion/rhinnorhea. Neck: No stridor.   Respiratory: Normal respiratory effort.   Musculoskeletal: Moves upper and lower extremities without any difficulty. Neurologic:  Normal speech and language. No gross focal neurologic deficits are appreciated. No gait instability. Skin:  Skin is warm, dry and intact. Laceration healing without any signs of infection. Psychiatric: Mood and affect are normal. Speech and behavior are normal.  ____________________________________________   LABS (all labs ordered are listed, but only abnormal results are displayed)  Labs Reviewed - No data to display  PROCEDURES  Procedure(s) performed: None  Critical Care performed: No  ____________________________________________   INITIAL IMPRESSION / ASSESSMENT AND PLAN / ED COURSE  Pertinent labs & imaging results that were available during my care of the patient were reviewed by me and considered in my medical decision making (see chart for details).  Laceration was sutured on 12/29 sutures of only been a maximum of 8 days. With suture line going across  his elbow and it was recommended that he return on January 12 for suture removal. ____________________________________________   FINAL CLINICAL IMPRESSION(S) / ED DIAGNOSES  Final diagnoses:  Visit for wound check      Tommi RumpsRhonda L Summers, PA-C 06/13/15 1259  Gayla DossEryka A Gayle, MD 06/13/15 1553

## 2015-06-13 NOTE — ED Notes (Signed)
No drainage, redness or bleeding at suture site

## 2015-06-13 NOTE — ED Notes (Signed)
Pt here for suture removal on right arm

## 2015-06-17 ENCOUNTER — Ambulatory Visit
Admission: EM | Admit: 2015-06-17 | Discharge: 2015-06-17 | Disposition: A | Discharge status: Home / Self Care | Payer: Worker's Compensation | Attending: Family Medicine | Admitting: Family Medicine

## 2015-06-17 DIAGNOSIS — IMO0001 Reserved for inherently not codable concepts without codable children: Secondary | ICD-10-CM

## 2015-06-17 DIAGNOSIS — S51001D Unspecified open wound of right elbow, subsequent encounter: Secondary | ICD-10-CM

## 2015-06-17 DIAGNOSIS — T814XXA Infection following a procedure, initial encounter: Secondary | ICD-10-CM

## 2015-06-17 DIAGNOSIS — Z4802 Encounter for removal of sutures: Secondary | ICD-10-CM

## 2015-06-17 HISTORY — DX: Obesity, unspecified: E66.9

## 2015-06-17 MED ORDER — MELOXICAM 15 MG PO TABS: 15.0000 mg | ORAL_TABLET | Freq: Every day | ORAL | Status: DC

## 2015-06-17 MED ORDER — CEFUROXIME AXETIL 500 MG PO TABS: 500.0000 mg | ORAL_TABLET | Freq: Two times a day (BID) | ORAL | Status: DC

## 2015-06-17 MED ORDER — MUPIROCIN 2 % EX OINT: 1.0000 "application " | TOPICAL_OINTMENT | Freq: Three times a day (TID) | CUTANEOUS | Status: DC

## 2015-06-17 NOTE — ED Provider Notes (Signed)
CSN: [id number]     Arrival date & time 06/17/15  1341 History   First MD Initiated Contact with Patient 06/17/15 1624     Chief Complaint  Patient presents with  . Medication Refill    Nurses notes were reviewed.  (Consider location/radiation/quality/duration/timing/severity/associated sxs/prior Treatment) HPI    Patient reports December 29 lacerated his right elbow by following door at work. He was seen in the ED and sutures are placed. He returned the next day to make sure that the bleeding had presented control. Interesting he states that the pain was improved initially but the last few days has had increased pain in the right elbow. He also reports that motion and mobility is been decreasing in the last 2-4 days as well. Because he is having trouble work being lethargic and having pain to just taking tramadol workplace was concerned and sent him here for evaluation. He states that he took Tylenol but did not help with the pain.   Past Medical History  Diagnosis Date  . Obesity    Past Surgical History  Procedure Laterality Date  . Tooth extraction     Family History  Problem Relation Age of Onset  . Cancer Father    Social History  Substance Use Topics  . Smoking status: Never Smoker   . Smokeless tobacco: None  . Alcohol Use: Yes     Comment: socially    Review of Systems  All other systems reviewed and are negative.   Allergies  Review of patient's allergies indicates not on file.  Home Medications   Prior to Admission medications   Medication Sig Start Date End Date Taking? Authorizing Provider  traMADol (ULTRAM) 50 MG tablet Take 1 tablet (50 mg total) by mouth every 6 (six) hours as needed. 06/04/15 06/03/16  Joni Reiningonald K Smith, PA-C   Meds Ordered and Administered this Visit  Medications - No data to display  BP 157/96 mmHg  Pulse 91  Temp(Src) 97.1 F (36.2 C) (Tympanic)  Resp 16  Ht 6' (1.829 m)  Wt 261 lb (118.389 kg)  BMI 35.39 kg/m2  SpO2  98% No data found.   Physical Exam  Constitutional: He is oriented to person, place, and time. He appears well-developed and well-nourished.  HENT:  Head: Normocephalic and atraumatic.  Eyes: Conjunctivae are normal.  Neck: Neck supple.  Musculoskeletal: Normal range of motion. He exhibits no edema.  Neurological: He is alert and oriented to person, place, and time.  Skin: Skin is warm and dry. No rash noted. There is erythema.  Psychiatric: He has a normal mood and affect.  Vitals reviewed.   ED Course  .Suture Removal Date/Time: 06/17/2015 4:35 PM Performed by: Hassan RowanWADE, Aedon Deason Authorized by: Hassan RowanWADE, Amilyah Nack Consent: Verbal consent obtained. Written consent not obtained. Consent given by: patient Body area: upper extremity Location details: right elbow Wound Appearance: red Sutures Removed: 18 Comments: Patient's elbow was warm and hot to the touch roughly 18 sutures were removed. There is some gapping of the wound and definite limitation of his complete flexion of the elbow.   (including critical care time)  Labs Review Labs Reviewed - No data to display  Imaging Review No results found.   Visual Acuity Review  Right Eye Distance:   Left Eye Distance:   Bilateral Distance:    Right Eye Near:   Left Eye Near:    Bilateral Near:         MDM   1. Elbow wound, right, subsequent encounter  2. Wound, surgical, infected, initial encounter   3. Visit for suture removal     We'll treat the right elbow laceration as a post infection. We'll place on Ceftin 500 mg 1 tablet twice a day. Bactroban ointment to apply to the wound 3 times a day. Due to the cyst sedated of the tramadol will stop the tramadol. Place on Mobic 15 mg 1 tablet a day. Will refer to occupational health and Glenard Haring for evaluation in 2-3 days.    Hassan Rowan, MD 06/17/15 [phone]

## 2015-06-17 NOTE — Discharge Instructions (Signed)
Wound Infection A wound infection happens when a type of germ (bacteria) grows in a wound. Caring for the infection can help the wound heal. Wound infections need treatment. HOME CARE   Only take medicine as told by your doctor.  Take your antibiotic medicine as told. Finish it even if you start to feel better.  Clean the wound with mild soap and water as told. Rinse the soap off. Pat the area dry with a clean towel. Do not rub the wound.  Change any bandages (dressings) as told by your doctor.  Put cream and a bandage on the wound as told by your doctor.  If the bandage sticks, wet it with soapy water to remove the bandage.  Change the bandage if it gets wet, dirty, or starts to smell.  Take showers. Do not take baths, swim, or do anything that puts your wound under water.  Avoid exercise that makes you sweat.  If your wound itches, use a medicine that helps stop itching. Do not pick or scratch at the wound.  Keep all doctor visits as told. GET HELP RIGHT AWAY IF:   You have more puffiness (swelling), pain, or redness around the wound.  You have more yellowish-white fluid (pus) coming from the wound.  You have a bad smell coming from the wound.  Your wound breaks open more.  You have a fever. MAKE SURE YOU:   Understand these instructions.  Will watch your condition.  Will get help right away if you are not doing well or get worse.   This information is not intended to replace advice given to you by your health care provider. Make sure you discuss any questions you have with your health care provider.   Document Released: 03/02/2008 Document Revised: 08/16/2011 Document Reviewed: 11/11/2014 Elsevier Interactive Patient Education 2016 Elsevier Inc.  Suture Removal, Care After Refer to this sheet in the next few weeks. These instructions provide you with information on caring for yourself after your procedure. Your health care provider may also give you more specific  instructions. Your treatment has been planned according to current medical practices, but problems sometimes occur. Call your health care provider if you have any problems or questions after your procedure. WHAT TO EXPECT AFTER THE PROCEDURE After your stitches (sutures) are removed, it is typical to have the following:  Some discomfort and swelling in the wound area.  Slight redness in the area. HOME CARE INSTRUCTIONS   If you have skin adhesive strips over the wound area, do not take the strips off. They will fall off on their own in a few days. If the strips remain in place after 14 days, you may remove them.  Change any bandages (dressings) at least once a day or as directed by your health care provider. If the bandage sticks, soak it off with warm, soapy water.  Apply cream or ointment only as directed by your health care provider. If using cream or ointment, wash the area with soap and water 2 times a day to remove all the cream or ointment. Rinse off the soap and pat the area dry with a clean towel.  Keep the wound area dry and clean. If the bandage becomes wet or dirty, or if it develops a bad smell, change it as soon as possible.  Continue to protect the wound from injury.  Use sunscreen when out in the sun. New scars become sunburned easily. SEEK MEDICAL CARE IF:  You have increasing redness, swelling, or pain  in the wound.  You see pus coming from the wound.  You have a fever.  You notice a bad smell coming from the wound or dressing.  Your wound breaks open (edges not staying together).   This information is not intended to replace advice given to you by your health care provider. Make sure you discuss any questions you have with your health care provider.   Document Released: 02/16/2001 Document Revised: 03/14/2013 Document Reviewed: 01/03/2013 Elsevier Interactive Patient Education [employer]

## 2015-06-17 NOTE — ED Notes (Signed)
Laceration to right elbow on 06/04/15. States needs clarification of work restriction note for "light duty that they don't have me doing anything". Also states Tramadol "makes me sleepy" when at work. Needs a different medication that won't make patient tired. Wound healing well.

## 2015-06-17 NOTE — ED Notes (Signed)
Sutures removed by Dr. Thurmond ButtsWade. Wound cleansed again and steri strips applied with sterile 4x4's, conform bandage and retelast

## 2015-08-07 ENCOUNTER — Ambulatory Visit
Admission: RE | Admit: 2015-08-07 | Discharge: 2015-08-07 | Disposition: A | Discharge status: Home / Self Care | Payer: Worker's Compensation | Source: Ambulatory Visit | Attending: Family | Admitting: Family

## 2015-08-07 ENCOUNTER — Other Ambulatory Visit: Payer: Self-pay | Admitting: Family

## 2015-08-07 ENCOUNTER — Ambulatory Visit
Admission: RE | Admit: 2015-08-07 | Discharge: 2015-08-07 | Disposition: A | Discharge status: Home / Self Care | Payer: 59 | Source: Ambulatory Visit | Attending: Family | Admitting: Family

## 2015-08-07 DIAGNOSIS — M7989 Other specified soft tissue disorders: Secondary | ICD-10-CM | POA: Diagnosis not present

## 2015-08-07 DIAGNOSIS — X58XXXA Exposure to other specified factors, initial encounter: Secondary | ICD-10-CM | POA: Diagnosis not present

## 2015-08-07 DIAGNOSIS — IMO0002 Reserved for concepts with insufficient information to code with codable children: Secondary | ICD-10-CM

## 2015-08-07 DIAGNOSIS — R52 Pain, unspecified: Secondary | ICD-10-CM

## 2015-08-07 DIAGNOSIS — S51011A Laceration without foreign body of right elbow, initial encounter: Secondary | ICD-10-CM | POA: Insufficient documentation

## 2015-08-07 DIAGNOSIS — M25521 Pain in right elbow: Secondary | ICD-10-CM | POA: Insufficient documentation

## 2015-12-30 ENCOUNTER — Emergency Department
Admission: EM | Admit: 2015-12-30 | Discharge: 2015-12-30 | Disposition: A | Discharge status: Home / Self Care | Payer: 59 | Attending: Emergency Medicine | Admitting: Emergency Medicine

## 2015-12-30 ENCOUNTER — Encounter: Payer: Self-pay | Admitting: Emergency Medicine

## 2015-12-30 ENCOUNTER — Emergency Department: Payer: 59

## 2015-12-30 DIAGNOSIS — K429 Umbilical hernia without obstruction or gangrene: Secondary | ICD-10-CM

## 2015-12-30 DIAGNOSIS — Z791 Long term (current) use of non-steroidal anti-inflammatories (NSAID): Secondary | ICD-10-CM | POA: Insufficient documentation

## 2015-12-30 DIAGNOSIS — R1033 Periumbilical pain: Secondary | ICD-10-CM | POA: Diagnosis present

## 2015-12-30 LAB — URINALYSIS COMPLETE WITH MICROSCOPIC (ARMC ONLY)
Bacteria, UA: NONE SEEN
Bilirubin Urine: NEGATIVE
GLUCOSE, UA: NEGATIVE mg/dL
HGB URINE DIPSTICK: NEGATIVE
KETONES UR: NEGATIVE mg/dL
LEUKOCYTES UA: NEGATIVE
NITRITE: NEGATIVE
PROTEIN: NEGATIVE mg/dL
SPECIFIC GRAVITY, URINE: 1.018 (ref 1.005–1.030)
pH: 5 (ref 5.0–8.0)

## 2015-12-30 LAB — COMPREHENSIVE METABOLIC PANEL
ALBUMIN: 4.1 g/dL (ref 3.5–5.0)
ALK PHOS: 77 U/L (ref 38–126)
ALT: 35 U/L (ref 17–63)
ANION GAP: 7 (ref 5–15)
AST: 35 U/L (ref 15–41)
BILIRUBIN TOTAL: 0.4 mg/dL (ref 0.3–1.2)
BUN: 12 mg/dL (ref 6–20)
CALCIUM: 8.8 mg/dL — AB (ref 8.9–10.3)
CO2: 26 mmol/L (ref 22–32)
Chloride: 105 mmol/L (ref 101–111)
Creatinine, Ser: 0.84 mg/dL (ref 0.61–1.24)
GLUCOSE: 96 mg/dL (ref 65–99)
POTASSIUM: 3.6 mmol/L (ref 3.5–5.1)
Sodium: 138 mmol/L (ref 135–145)
TOTAL PROTEIN: 7.9 g/dL (ref 6.5–8.1)

## 2015-12-30 LAB — CBC
HCT: 42.4 % (ref 40.0–52.0)
HEMOGLOBIN: 14.1 g/dL (ref 13.0–18.0)
MCH: 26.4 pg (ref 26.0–34.0)
MCHC: 33.3 g/dL (ref 32.0–36.0)
MCV: 79.2 fL — ABNORMAL LOW (ref 80.0–100.0)
Platelets: 337 10*3/uL (ref 150–440)
RBC: 5.35 MIL/uL (ref 4.40–5.90)
RDW: 14 % (ref 11.5–14.5)
WBC: 8.1 10*3/uL (ref 3.8–10.6)

## 2015-12-30 LAB — LIPASE, BLOOD: Lipase: 17 U/L (ref 11–51)

## 2015-12-30 MED ORDER — IOPAMIDOL (ISOVUE-300) INJECTION 61%
125.0000 mL | Freq: Once | INTRAVENOUS | Status: AC | PRN
  Administered 2015-12-30: 125 mL via INTRAVENOUS
  Filled 2015-12-30: qty 150

## 2015-12-30 MED ORDER — DIATRIZOATE MEGLUMINE & SODIUM 66-10 % PO SOLN
15.0000 mL | Freq: Once | ORAL | Status: AC
  Administered 2015-12-30: 15 mL via ORAL

## 2015-12-30 NOTE — ED Provider Notes (Signed)
[facility] Emergency Department Provider Note  ____________________________________________  Time seen: Approximately 5:44 PM  I have reviewed the triage vital signs and the nursing notes.   HISTORY  Chief Complaint Abdominal Pain    HPI Alan Nguyen is a 45 y.o. male , NAD, presents to the emergency department with 2 week history of periumbilical abdominal pain.  Patient states that the pain first started when he was bent over tying his shoe. The pain comes and goes but is worse with lifting heavy objects and/or having a bowel movement. He describes the pain as being sharp in nature and a current pain scale of 8/10.   Was seen by his primary care provider today and noted to have an umbilical hernia. His primary care provider referred him to the emergency department due to increasing pain to be evaluated for potentially incarcerated hernia.  Patient denies nausea, vomiting, diarrhea, decreased appetite, anorexia, fevers, chills, body aches. Has had no injuries or traumas to the abdomen. Denies dysuria, hematuria, pelvic pain, saddle paresthesias.   Past Medical History:  Diagnosis Date  . Obesity     There are no active problems to display for this patient.   Past Surgical History:  Procedure Laterality Date  . TOOTH EXTRACTION      Current Outpatient Rx  . Order #: [id number] Class: Normal  . Order #: [id number] Class: Normal  . Order #: [id number] Class: Normal  . Order #: [id number] Class: Print    Allergies Review of patient's allergies indicates not on file.  Family History  Problem Relation Age of Onset  . Cancer Father     Social History Social History  Substance Use Topics  . Smoking status: Never Smoker  . Smokeless tobacco: Never Used  . Alcohol use Yes     Comment: socially     Review of Systems  Constitutional: No fever/chills, fatigue Cardiovascular: No chest pain. Respiratory: No cough. No shortness of breath. No wheezing.   Gastrointestinal: Periumbilical pain x 2 weeks. No nausea, vomiting.  No diarrhea, constipation.  Genitourinary: Negative for dysuria, hematuria. No urinary hesitancy, urgency or increased frequency. Musculoskeletal: Negative for back pain, general myalgias.  Skin: Negative for rash, redness, swelling, open wounds, skin sores. Neurological: Negative for headaches, focal weakness or numbness.  No tingling, saddle paresthesias, loss of bowel or bladder control. 10-point ROS otherwise negative.  ____________________________________________   PHYSICAL EXAM:  VITAL SIGNS: ED Triage Vitals  Enc Vitals Group     BP 12/30/15 1642 (!) 148/103     Pulse Rate 12/30/15 1642 89     Resp 12/30/15 1642 18     Temp 12/30/15 1642 98.2 F (36.8 C)     Temp Source 12/30/15 1642 Oral     SpO2 12/30/15 1642 100 %     Weight 12/30/15 1642 275 lb (124.7 kg)     Height 12/30/15 1642 6' (1.829 m)     Head Circumference --      Peak Flow --      Pain Score 12/30/15 1643 8     Pain Loc --      Pain Edu? --      Excl. in GC? --      Constitutional: Alert and oriented. Well appearing and in no acute distress. Eyes: Conjunctivae are normalWithout icterus or injection.  Head: Atraumatic. Neck: Supple with full range of motion Lymph: No cervical lymphadenopathy Cardiovascular: Normal rate, regular rhythm. Normal S1 and S2.  Good peripheral circulation. Respiratory: Normal respiratory effort without tachypnea  or retractions. Lungs CTAB. Gastrointestinal: Abdomen is soft and nontender without distention or guarding in all quadrants. Moderate tenderness to palpation about the 7:00 to 10:00 portion of the right umbilicus. Trace bulges noted at this area that is able to be reduced with minimal pressure. No rigidity or rebound tenderness is noted. Bowel sounds are grossly normal active in all quadrants. No pulsatile masses are noted. No hepatosplenomegaly. Musculoskeletal: No lower extremity tenderness nor  edema.  No joint effusions. Neurologic:  Normal speech and language. No gross focal neurologic deficits are appreciated.  Skin:  Skin is warm, dry and intact. No rash, redness, swelling, skin sores noted. Psychiatric: Mood and affect are normal. Speech and behavior are normal. Patient exhibits appropriate insight and judgement.   ____________________________________________   LABS (all labs ordered are listed, but only abnormal results are displayed)  Labs Reviewed  COMPREHENSIVE METABOLIC PANEL - Abnormal; Notable for the following:       Result Value   Calcium 8.8 (*)    All other components within normal limits  CBC - Abnormal; Notable for the following:    MCV 79.2 (*)    All other components within normal limits  URINALYSIS COMPLETEWITH MICROSCOPIC ([facility] ONLY) - Abnormal; Notable for the following:    Color, Urine YELLOW (*)    APPearance CLEAR (*)    Squamous Epithelial / LPF 0-5 (*)    All other components within normal limits  LIPASE, BLOOD   ____________________________________________  EKG  None ____________________________________________  RADIOLOGY I have personally viewed and evaluated these images (plain radiographs) as part of my medical decision making, as well as reviewing the written report by the radiologist.  Ct Abdomen Pelvis W Contrast  Result Date: 12/30/2015 CLINICAL DATA:  Periumbilical pain for 2 weeks EXAM: CT ABDOMEN AND PELVIS WITH CONTRAST TECHNIQUE: Multidetector CT imaging of the abdomen and pelvis was performed using the standard protocol following bolus administration of intravenous contrast. CONTRAST:  ISOVUE-300 IOPAMIDOL (ISOVUE-300) INJECTION 61% COMPARISON:  None. FINDINGS: Lower chest:  No acute findings. Hepatobiliary: The liver is fatty infiltrated. The gallbladder is within normal limits. No biliary ductal dilatation is seen. Pancreas: No mass, inflammatory changes, or other significant abnormality. Spleen: Within normal limits  in size and appearance. Adrenals/Urinary Tract: No renal calculi are noted. A small 1 cm cyst is noted within the right kidney. No obstructive changes are seen. The bladder is partially distended. Stomach/Bowel: No evidence of obstruction, inflammatory process, or abnormal fluid collections. The appendix is within normal limits. Vascular/Lymphatic: No pathologically enlarged lymph nodes. No evidence of abdominal aortic aneurysm. Reproductive: No mass or other significant abnormality. Other: Small periumbilical fat containing hernia is identified. No bowel is noted within. This may be the etiology of the patient's underlying discomfort. Small fat containing left inguinal hernia is noted. Musculoskeletal:  No suspicious bone lesions identified. IMPRESSION: Periumbilical hernia containing omental fat. This may be related to the patient's underlying discomfort. Small fat containing left inguinal hernia. No other acute abnormality is seen. Electronically Signed   By: Alcide Clever M.D.   On: 12/30/2015 19:10   ____________________________________________    PROCEDURES  Procedure(s) performed: None      Medications  diatrizoate meglumine-sodium (GASTROGRAFIN) 66-10 % solution 15 mL (15 mLs Oral Given 12/30/15 1840)  iopamidol (ISOVUE-300) 61 % injection 125 mL (125 mLs Intravenous Contrast Given 12/30/15 1841)     ____________________________________________   INITIAL IMPRESSION / ASSESSMENT AND PLAN / ED COURSE  Pertinent labs & imaging results that were available  during my care of the patient were reviewed by me and considered in my medical decision making (see chart for details).  Patient's diagnosis is consistent with Umbilical hernia without obstruction nor gangrene. Patient will be discharged home with instructions for home care outlined in exit care given to the patient. Patient is to follow up with his primary care provider or Dr. Orvis Brill in general surgery if symptoms persist past this  treatment course. Patient is given ED precautions to return to the ED for any worsening or new symptoms.      ____________________________________________  FINAL CLINICAL IMPRESSION(S) / ED DIAGNOSES  Final diagnoses:  Umbilical hernia without obstruction and without gangrene      NEW MEDICATIONS STARTED DURING THIS VISIT:  New Prescriptions   No medications on file         Hope Pigeon, PA-C 12/30/15 1918    Sharyn Creamer, MD 12/30/15 2207

## 2015-12-30 NOTE — ED Notes (Signed)
See triage note   Mid abd pain for 2 weeks   Intermittent  Then became worse over the past couple of days   Denies any n/v/d/ or fever   Afebrile on arrival

## 2015-12-30 NOTE — ED Triage Notes (Signed)
Pt to ed with c/o abd pain x 2 weeks,  Pain worse around umbilicus.  Pt denies n/v/d.  States last bm was today and wnl.

## 2016-01-01 ENCOUNTER — Telehealth: Payer: Self-pay

## 2016-01-01 NOTE — Telephone Encounter (Signed)
Please schedule patient to follow-up in office.  Emergency Room Follow-up (12/30/15): Umbilical Hernia Repair

## 2017-07-05 ENCOUNTER — Other Ambulatory Visit: Payer: Self-pay | Admitting: Podiatry

## 2017-07-05 ENCOUNTER — Ambulatory Visit (INDEPENDENT_AMBULATORY_CARE_PROVIDER_SITE_OTHER): Payer: 59 | Admitting: Podiatry

## 2017-07-05 ENCOUNTER — Ambulatory Visit (INDEPENDENT_AMBULATORY_CARE_PROVIDER_SITE_OTHER): Payer: 59

## 2017-07-05 ENCOUNTER — Encounter: Payer: Self-pay | Admitting: Podiatry

## 2017-07-05 DIAGNOSIS — M21612 Bunion of left foot: Secondary | ICD-10-CM

## 2017-07-05 DIAGNOSIS — M2012 Hallux valgus (acquired), left foot: Secondary | ICD-10-CM | POA: Diagnosis not present

## 2017-07-05 DIAGNOSIS — M79672 Pain in left foot: Secondary | ICD-10-CM

## 2017-07-05 NOTE — Progress Notes (Signed)
   Subjective:    Patient ID: Alan Nguyen, male    DOB: 01/27/1971, 47 y.o.   MRN: [id number]  HPI    Review of Systems  All other systems reviewed and are negative.      Objective:   Physical Exam        Assessment & Plan:

## 2017-07-07 NOTE — Progress Notes (Signed)
   Subjective: 47 year old male presenting today as a new patient with a chief complaint of pain to the medial side of the left foot that began about three weeks ago. Walking increases the pain. There are no alleviating factors noted. He has not done anything to treat the symptoms. Patient is here for further evaluation and treatment.   Past Medical History:  Diagnosis Date  . Obesity       Objective: Physical Exam General: The patient is alert and oriented x3 in no acute distress.  Dermatology: Skin is cool, dry and supple bilateral lower extremities. Negative for open lesions or macerations.  Vascular: Palpable pedal pulses bilaterally. No edema or erythema noted. Capillary refill within normal limits.  Neurological: Epicritic and protective threshold grossly intact bilaterally.   Musculoskeletal Exam: Clinical evidence of bunion deformity noted to the respective foot. There is a moderate pain on palpation range of motion of the first MPJ. Lateral deviation of the hallux noted consistent with hallux abductovalgus.  Radiographic Exam: Increased intermetatarsal angle greater than 15 with a hallux abductus angle greater than 30 noted on AP view. Moderate degenerative changes noted within the first MPJ.  Assessment: 1. HAV w/ bunion deformity bilaterally, left > right   Plan of Care:  1. Patient was evaluated. X-Rays reviewed. 2. Today we discussed the conservative versus surgical management of the presenting pathology. The patient opts for surgical management. All possible complications and details of the procedure were explained. All patient questions were answered. No guarantees were expressed or implied. 3. Authorization for surgery was initiated today. Surgery will consist of bunionectomy with metatarsal osteotomy left foot.  4. CAM boot dispensed.  5. Return to clinic 1 week post op.    Felecia ShellingBrent M. Evans, DPM [facility]  Dr. Felecia ShellingBrent M. Evans, DPM    [address]                                        [place], [place] [address]                Office [phone]  Fax [phone]

## 2017-07-25 ENCOUNTER — Encounter: Payer: Self-pay | Admitting: Podiatry

## 2017-07-25 ENCOUNTER — Ambulatory Visit (INDEPENDENT_AMBULATORY_CARE_PROVIDER_SITE_OTHER): Payer: 59 | Admitting: Podiatry

## 2017-07-25 DIAGNOSIS — M21612 Bunion of left foot: Secondary | ICD-10-CM

## 2017-07-25 NOTE — Patient Instructions (Signed)
Pre-Operative Instructions  Congratulations, you have decided to take an important step towards improving your quality of life.  You can be assured that the doctors and staff at [facility] will be with you every step of the way.  Here are some important things you should know:  1. Plan to be at the surgery center/hospital at least 1 (one) hour prior to your scheduled time, unless otherwise directed by the surgical center/hospital staff.  You must have a responsible adult accompany you, remain during the surgery and drive you home.  Make sure you have directions to the surgical center/hospital to ensure you arrive on time. 2. If you are having surgery at [facility] or [facility], you will need a copy of your medical history and physical form from your family physician within one month prior to the date of surgery. We will give you a form for your primary physician to complete.  3. We make every effort to accommodate the date you request for surgery.  However, there are times where surgery dates or times have to be moved.  We will contact you as soon as possible if a change in schedule is required.   4. No aspirin/ibuprofen for one week before surgery.  If you are on aspirin, any non-steroidal anti-inflammatory medications (Mobic, Aleve, Ibuprofen) should not be taken seven (7) days prior to your surgery.  You make take Tylenol for pain prior to surgery.  5. Medications - If you are taking daily heart and blood pressure medications, seizure, reflux, allergy, asthma, anxiety, pain or diabetes medications, make sure you notify the surgery center/hospital before the day of surgery so they can tell you which medications you should take or avoid the day of surgery. 6. No food or drink after midnight the night before surgery unless directed otherwise by surgical center/hospital staff. 7. No alcoholic beverages 24-hours prior to surgery.  No smoking 24-hours prior or 24-hours after  surgery. 8. Wear loose pants or shorts. They should be loose enough to fit over bandages, boots, and casts. 9. Don't wear slip-on shoes. Sneakers are preferred. 10. Bring your boot with you to the surgery center/hospital.  Also bring crutches or a walker if your physician has prescribed it for you.  If you do not have this equipment, it will be provided for you after surgery. 11. If you have not been contacted by the surgery center/hospital by the day before your surgery, call to confirm the date and time of your surgery. 12. Leave-time from work may vary depending on the type of surgery you have.  Appropriate arrangements should be made prior to surgery with your employer. 13. Prescriptions will be provided immediately following surgery by your doctor.  Fill these as soon as possible after surgery and take the medication as directed. Pain medications will not be refilled on weekends and must be approved by the doctor. 14. Remove nail polish on the operative foot and avoid getting pedicures prior to surgery. 15. Wash the night before surgery.  The night before surgery wash the foot and leg well with water and the antibacterial soap provided. Be sure to pay special attention to beneath the toenails and in between the toes.  Wash for at least three (3) minutes. Rinse thoroughly with water and dry well with a towel.  Perform this wash unless told not to do so by your physician.  Enclosed: 1 Ice pack (please put in freezer the night before surgery)   1 Hibiclens skin cleaner     Pre-op instructions  If you have any questions regarding the instructions, please do not hesitate to call our office.  [place]: [address], [place], [place] [address] -- [phone]  [place]: [address] , [place] [address] -- [phone]  [place]: [address]  , [place] [address] -- [phone]  [place]: [address], [address], [place],  [address] -- [phone]  Website:  [email or web address] 

## 2017-07-30 NOTE — Progress Notes (Signed)
Patient presented to the office this morning with the misunderstanding that he was scheduled for surgery today.  There apparently was a miscommunication regarding the time and location of the surgery.  [facility].  Surgery date and details were provided for the patient this morning.  Follow-up in the office 1 week postop

## 2017-08-09 ENCOUNTER — Telehealth: Payer: Self-pay | Admitting: *Deleted

## 2017-08-09 NOTE — Telephone Encounter (Signed)
"  I'm scheduled to have surgery on Thursday, the 8th.  I need to know what time to be there."  I called and left him a message that someone from the facility would call and let him know about his arrival time a day or two prior to his surgery date.  I asked him to call if he had ant additional questions.

## 2017-08-11 ENCOUNTER — Encounter: Payer: Self-pay | Admitting: Podiatry

## 2017-08-11 DIAGNOSIS — M2012 Hallux valgus (acquired), left foot: Secondary | ICD-10-CM | POA: Diagnosis not present

## 2017-08-12 NOTE — Progress Notes (Signed)
DOS 08/11/17 Bunionectomy w/ osteotomy Lt

## 2017-08-17 ENCOUNTER — Encounter: Payer: 59 | Admitting: Podiatry

## 2017-08-19 ENCOUNTER — Other Ambulatory Visit: Payer: Self-pay

## 2017-08-19 ENCOUNTER — Ambulatory Visit (INDEPENDENT_AMBULATORY_CARE_PROVIDER_SITE_OTHER): Payer: 59

## 2017-08-19 ENCOUNTER — Ambulatory Visit (INDEPENDENT_AMBULATORY_CARE_PROVIDER_SITE_OTHER): Payer: 59 | Admitting: Podiatry

## 2017-08-19 ENCOUNTER — Encounter: Payer: Self-pay | Admitting: Podiatry

## 2017-08-19 VITALS — BP 144/96 | Temp 96.8°F

## 2017-08-19 DIAGNOSIS — M21612 Bunion of left foot: Secondary | ICD-10-CM

## 2017-08-19 DIAGNOSIS — Z9889 Other specified postprocedural states: Secondary | ICD-10-CM

## 2017-08-19 MED ORDER — OXYCODONE-ACETAMINOPHEN 5-325 MG PO TABS: ORAL_TABLET | ORAL | 0 refills | Status: DC

## 2017-08-19 NOTE — Progress Notes (Signed)
   Subjective:  Patient presents today status post bunionectomy of the left foot. DOS: 08/11/17. He states he is doing well overall. He states the incisions look well. He denies any new complaints at this time. Patient is here for further evaluation and treatment.    Past Medical History:  Diagnosis Date  . Obesity       Objective/Physical Exam Neurovascular status intact.  Skin incisions appear to be well coapted with sutures and staples intact. No sign of infectious process noted. No dehiscence. No active bleeding noted. Moderate edema noted to the surgical extremity.  Radiographic Exam:  Orthopedic hardware and osteotomies sites appear to be stable with routine healing.  Assessment: 1. s/p bunionectomy left. DOS: 08/11/17   Plan of Care:  1. Patient was evaluated. X-rays reviewed 2. Dressing changed. Keep clean, dry and intact for one week.  3. Continue weightbearing in CAM boot.  4. Refill prescription for Percocet 5/325 mg provided to patient.  5. Return to clinic in one week for staple removal.    Felecia ShellingBrent M. Elody Kleinsasser, DPM [facility]  Dr. Felecia ShellingBrent M. Trapper Meech, DPM    [address]                                        [place], [place] [address]                Office [phone]  Fax [phone]

## 2017-08-30 ENCOUNTER — Encounter: Payer: Self-pay | Admitting: Podiatry

## 2017-08-30 ENCOUNTER — Ambulatory Visit (INDEPENDENT_AMBULATORY_CARE_PROVIDER_SITE_OTHER): Payer: 59 | Admitting: Podiatry

## 2017-08-30 DIAGNOSIS — M21612 Bunion of left foot: Secondary | ICD-10-CM

## 2017-08-30 DIAGNOSIS — Z9889 Other specified postprocedural states: Secondary | ICD-10-CM

## 2017-09-01 NOTE — Progress Notes (Signed)
   Subjective:  Patient presents today status post bunionectomy of the left foot. DOS: 08/11/17. He reports he is doing well overall. He reports some aching of the foot intermittently at night. There are no modifying factors noted. He has been wearing the CAM boot as instructed. Patient is here for further evaluation and treatment.    Past Medical History:  Diagnosis Date  . Obesity       Objective/Physical Exam Neurovascular status intact.  Skin incisions appear to be well coapted with sutures and staples intact. No sign of infectious process noted. No dehiscence. No active bleeding noted. Moderate edema noted to the surgical extremity.  Assessment: 1. s/p bunionectomy left. DOS: 08/11/17   Plan of Care:  1. Patient was evaluated.  2. Staples removed. Dry sterile dressing applied.  3. Post op shoe dispensed. Discontinue wearing CAM boot.  4. Return to clinic in 2 weeks.    Felecia ShellingBrent M. Evans, DPM [facility]  Dr. Felecia ShellingBrent M. Evans, DPM    [address]                                        [place], [place] [address]                Office [phone](336) [phone]  Fax [phone]

## 2017-09-16 ENCOUNTER — Ambulatory Visit: Payer: 59

## 2017-09-16 ENCOUNTER — Encounter: Payer: 59 | Admitting: Podiatry

## 2017-09-20 ENCOUNTER — Ambulatory Visit (INDEPENDENT_AMBULATORY_CARE_PROVIDER_SITE_OTHER): Payer: 59 | Admitting: Podiatry

## 2017-09-20 ENCOUNTER — Encounter: Payer: Self-pay | Admitting: Podiatry

## 2017-09-20 ENCOUNTER — Ambulatory Visit (INDEPENDENT_AMBULATORY_CARE_PROVIDER_SITE_OTHER): Payer: 59

## 2017-09-20 DIAGNOSIS — Z9889 Other specified postprocedural states: Secondary | ICD-10-CM | POA: Diagnosis not present

## 2017-09-20 DIAGNOSIS — M21612 Bunion of left foot: Secondary | ICD-10-CM | POA: Diagnosis not present

## 2017-09-27 NOTE — Progress Notes (Signed)
   Subjective:  Patient presents today status post bunionectomy of the left foot. DOS: 08/11/17. He states he is doing well overall. He reports some intermittent pain and continued swelling. He has been wearing the post op shoe as instructed. Patient is here for further evaluation and treatment.    Past Medical History:  Diagnosis Date  . Obesity       Objective/Physical Exam Neurovascular status intact.  Skin incisions appear to be well coapted. No sign of infectious process noted. No dehiscence. No active bleeding noted. Moderate edema noted to the surgical extremity.  Radiographic Exam:  Orthopedic hardware and osteotomies sites appear to be stable with routine healing.  Assessment: 1. s/p bunionectomy left. DOS: 08/11/17   Plan of Care:  1. Patient was evaluated.  2. Transition out of post op shoe into good sneakers.  3. Return to clinic in 4 weeks. At that point, he might be able to return to work.     Felecia ShellingBrent M. Evans, DPM [facility]  Dr. Felecia ShellingBrent M. Evans, DPM    [address]                                        [place], [place] [address]                Office [phone]  Fax [phone]

## 2017-09-28 DIAGNOSIS — M21612 Bunion of left foot: Secondary | ICD-10-CM

## 2017-10-18 ENCOUNTER — Ambulatory Visit (INDEPENDENT_AMBULATORY_CARE_PROVIDER_SITE_OTHER): Payer: 59

## 2017-10-18 ENCOUNTER — Ambulatory Visit (INDEPENDENT_AMBULATORY_CARE_PROVIDER_SITE_OTHER): Payer: 59 | Admitting: Podiatry

## 2017-10-18 ENCOUNTER — Encounter: Payer: Self-pay | Admitting: Podiatry

## 2017-10-18 DIAGNOSIS — Z9889 Other specified postprocedural states: Secondary | ICD-10-CM

## 2017-10-18 DIAGNOSIS — M21612 Bunion of left foot: Secondary | ICD-10-CM

## 2017-10-20 NOTE — Progress Notes (Signed)
   Subjective:  Patient presents today status post bunionectomy of the left foot. DOS: 08/11/17. He states he is doing well overall. He reports some tenderness of the plantar aspect of the hallux only when wearing shoes. He states he stumped the toe two weeks ago but the pain is improving now. Patient is here for further evaluation and treatment.   Past Medical History:  Diagnosis Date  . Obesity       Objective/Physical Exam Neurovascular status intact.  Skin incisions appear to be well coapted. No sign of infectious process noted. No dehiscence. No active bleeding noted. Moderate edema noted to the surgical extremity.  Radiographic Exam:  Orthopedic hardware and osteotomies sites appear to be stable with routine healing.  Assessment: 1. s/p bunionectomy left. DOS: 08/11/17   Plan of Care:  1. Patient was evaluated.  2. May resume full activity with no restrictions.  3. Return to work on 10/21/17.  4. Return to clinic as needed.    Felecia Shelling, DPM [facility]  Dr. Felecia Shelling, DPM    [address]                                        [place], [place] [address]                Office [phone]  Fax [phone]

## 2017-11-13 NOTE — Progress Notes (Signed)
This encounter was created in error - please disregard.

## 2019-01-24 ENCOUNTER — Ambulatory Visit
Admission: EM | Admit: 2019-01-24 | Discharge: 2019-01-24 | Disposition: A | Discharge status: Home / Self Care | Payer: 59 | Attending: Family Medicine | Admitting: Family Medicine

## 2019-01-24 ENCOUNTER — Other Ambulatory Visit: Payer: Self-pay

## 2019-01-24 ENCOUNTER — Encounter: Payer: Self-pay | Admitting: Emergency Medicine

## 2019-01-24 DIAGNOSIS — Y999 Unspecified external cause status: Secondary | ICD-10-CM

## 2019-01-24 DIAGNOSIS — M25561 Pain in right knee: Secondary | ICD-10-CM

## 2019-01-24 MED ORDER — MELOXICAM 15 MG PO TABS: 15.0000 mg | ORAL_TABLET | Freq: Every day | ORAL | 0 refills | Status: DC | PRN

## 2019-01-24 NOTE — ED Triage Notes (Signed)
Patient c/o right knee pain that started 2 days ago. Denies injury.

## 2019-01-24 NOTE — Discharge Instructions (Signed)
Take medication as prescribed. Rest. Ice.  Use brace and crutches over the next 3 days then gradually apply weight as tolerated.  Follow-up with orthopedic this coming week as needed for continued pain.  Follow up with your primary care physician this week as needed. Return to Urgent care for new or worsening concerns.

## 2019-01-24 NOTE — ED Provider Notes (Signed)
[facility] ____________________________________________  Time seen: Approximately 4:16 PM  I have reviewed the triage vital signs and the nursing notes.   HISTORY  Chief Complaint Knee Pain   HPI Alan Nguyen is a 48 y.o. male presenting for evaluation of right knee pain present for the last 2 days.  Patient reports no direct injury, abrupt onset or trauma.  Patient reports he woke up with the pain.  Patient reports he has been working a lot and he does a lot of walking throughout the day.  Patient states that day when he woke up he felt the pain that has continued.  States that he does feel pain with walking and it hurts to do so.  Denies history of chronic knee pain.  Denies giving way sensation.  Denies paresthesias, pain radiation or other injuries.  Did take Excedrin without resolution.  Had to call out of work today.  No recent cough, congestion, chest pain or shortness of breath.   Past Medical History:  Diagnosis Date  . Obesity     There are no active problems to display for this patient.   Past Surgical History:  Procedure Laterality Date  . TOOTH EXTRACTION       No current facility-administered medications for this encounter.   Current Outpatient Medications:  .  meloxicam (MOBIC) 15 MG tablet, Take 1 tablet (15 mg total) by mouth daily as needed., Disp: 10 tablet, Rfl: 0  Allergies Patient has no known allergies.  Family History  Problem Relation Age of Onset  . Cancer Father     Social History Social History   Tobacco Use  . Smoking status: Never Smoker  . Smokeless tobacco: Never Used  Substance Use Topics  . Alcohol use: Yes    Comment: socially  . Drug use: No    Review of Systems Constitutional: No fever ENT: No sore throat. Cardiovascular: Denies chest pain. Respiratory: Denies shortness of breath. Gastrointestinal: No abdominal pain.  Musculoskeletal: Positive right knee pain.  Skin: Negative for rash.   ____________________________________________   PHYSICAL EXAM:  VITAL SIGNS: ED Triage Vitals  Enc Vitals Group     BP 01/24/19 1535 (!) 152/103     Pulse Rate 01/24/19 1535 83     Resp 01/24/19 1535 18     Temp 01/24/19 1535 98.3 F (36.8 C)     Temp Source 01/24/19 1535 Oral     SpO2 01/24/19 1535 97 %     Weight 01/24/19 1534 265 lb (120.2 kg)     Height 01/24/19 1534 6' (1.829 m)     Head Circumference --      Peak Flow --      Pain Score 01/24/19 1534 9     Pain Loc --      Pain Edu? --      Excl. in [place]? --    Constitutional: Alert and oriented. Well appearing and in no acute distress. Eyes: Conjunctivae are normal.  ENT      Head: Normocephalic and atraumatic. Cardiovascular: Normal rate, regular rhythm. Grossly normal heart sounds.  Good peripheral circulation. Respiratory: Normal respiratory effort without tachypnea nor retractions. Breath sounds are clear and equal bilaterally. No wheezes, rales, rhonchi. Musculoskeletal:  Bilateral pedal pulses equal and easily palpated. Ambulatory with mild antalgic gait.  Except: Right knee mild diffuse tenderness, more so medially, no laxity noted, no pain anterior posterior drawer test, mild pain with medial and lateral stress, able to fully weight-bear, right lower extremity otherwise nontender,  no edema, able to fully extend and flex. Neurologic:  Normal speech and language.  Skin:  Skin is warm, dry  Psychiatric: Mood and affect are normal. Speech and behavior are normal. Patient exhibits appropriate insight and judgment   ___________________________________________   LABS (all labs ordered are listed, but only abnormal results are displayed)  Labs Reviewed - No data to display  PROCEDURES Procedures     INITIAL IMPRESSION / ASSESSMENT AND PLAN / ED COURSE  Pertinent labs & imaging results that were available during my care of the patient were reviewed by me and considered in my medical decision making (see chart for  details).  Well-appearing patient.  No acute distress.  Right knee pain the last 2 days.  Denies trauma.  Does do a lot of activity at work.  Suspect strain injury.  As no direct injury or abrupt onset will defer x-ray at this time, patient agrees.  Crutches and knee splint given for support for the next 2 to 3 days.  Gradually increase activity as tolerated.  Follow-up with orthopedic this coming week if pain continues.  Oral Mobic.Discussed indication, risks and benefits of medications with patient.  Work note given.  Discussed follow up with Primary care physician this week as needed. Discussed follow up and return parameters including no resolution or any worsening concerns. Patient verbalized understanding and agreed to plan.   ____________________________________________   FINAL CLINICAL IMPRESSION(S) / ED DIAGNOSES  Final diagnoses:  Acute pain of right knee     ED Discharge Orders         Ordered    meloxicam (MOBIC) 15 MG tablet  Daily PRN     01/24/19 1618           Note: This dictation was prepared with Dragon dictation along with smaller phrase technology. Any transcriptional errors that result from this process are unintentional.         Renford DillsMiller, Aviendha Azbell, NP 01/24/19 1641

## 2019-08-09 ENCOUNTER — Ambulatory Visit: Payer: 59 | Attending: Internal Medicine

## 2019-08-09 DIAGNOSIS — Z20822 Contact with and (suspected) exposure to covid-19: Secondary | ICD-10-CM

## 2019-08-10 LAB — NOVEL CORONAVIRUS, NAA: SARS-CoV-2, NAA: DETECTED — AB

## 2019-08-11 ENCOUNTER — Telehealth: Payer: Self-pay | Admitting: Adult Health

## 2019-08-11 NOTE — Telephone Encounter (Signed)
Contacted patient regarding positive COVID 19 test results and possible treatment option with Monoclonal Antibody Infusion as he is high risk category for possible complications for COVID 19 complications . Unable to reach left message and sent MyChart message.   Alan Poehler NP-C

## 2019-08-13 ENCOUNTER — Telehealth: Payer: Self-pay | Admitting: Adult Health

## 2019-08-13 NOTE — Telephone Encounter (Signed)
Returned patient phone call about Bamlanivimab infusion therapy.  He noted that he tested positive for COVID on 3/4 after noting he was exposed, but did not become symptomatic until 3/5-3/6.  He is currently fatigued, and has generalized achiness.  His BMI is 37.97 (height 6 foot, weight, 280 lb).  I reviewed with him that based on his BMI of greater than 35 he meets criteria to receive the monoclonal antibody infusion.  I discussed the risks, benefits, logistics, and available times for the infusion.  He would like to discuss with his wife and then return my call.  He knows that to receive infusion today at 230, he must call back before noon.    Lillard Anes, NP

## 2019-08-21 ENCOUNTER — Inpatient Hospital Stay: Payer: BC Managed Care – PPO

## 2019-08-21 ENCOUNTER — Inpatient Hospital Stay
Admission: EM | Admit: 2019-08-21 | Discharge: 2019-08-25 | DRG: 177 | Disposition: A | Discharge status: Home / Self Care | Payer: BC Managed Care – PPO | Attending: Internal Medicine | Admitting: Internal Medicine

## 2019-08-21 ENCOUNTER — Other Ambulatory Visit: Payer: Self-pay

## 2019-08-21 ENCOUNTER — Emergency Department: Payer: BC Managed Care – PPO

## 2019-08-21 DIAGNOSIS — J9601 Acute respiratory failure with hypoxia: Secondary | ICD-10-CM | POA: Diagnosis present

## 2019-08-21 DIAGNOSIS — Z809 Family history of malignant neoplasm, unspecified: Secondary | ICD-10-CM

## 2019-08-21 DIAGNOSIS — U071 COVID-19: Secondary | ICD-10-CM | POA: Diagnosis present

## 2019-08-21 DIAGNOSIS — E669 Obesity, unspecified: Secondary | ICD-10-CM | POA: Diagnosis present

## 2019-08-21 DIAGNOSIS — R0902 Hypoxemia: Secondary | ICD-10-CM | POA: Diagnosis present

## 2019-08-21 DIAGNOSIS — J069 Acute upper respiratory infection, unspecified: Secondary | ICD-10-CM | POA: Diagnosis not present

## 2019-08-21 DIAGNOSIS — J1282 Pneumonia due to coronavirus disease 2019: Secondary | ICD-10-CM | POA: Diagnosis present

## 2019-08-21 DIAGNOSIS — R079 Chest pain, unspecified: Secondary | ICD-10-CM | POA: Diagnosis present

## 2019-08-21 DIAGNOSIS — Z6837 Body mass index (BMI) 37.0-37.9, adult: Secondary | ICD-10-CM | POA: Diagnosis not present

## 2019-08-21 LAB — CBC
HCT: 45.1 % (ref 39.0–52.0)
Hemoglobin: 14.6 g/dL (ref 13.0–17.0)
MCH: 25 pg — ABNORMAL LOW (ref 26.0–34.0)
MCHC: 32.4 g/dL (ref 30.0–36.0)
MCV: 77.2 fL — ABNORMAL LOW (ref 80.0–100.0)
Platelets: 418 10*3/uL — ABNORMAL HIGH (ref 150–400)
RBC: 5.84 MIL/uL — ABNORMAL HIGH (ref 4.22–5.81)
RDW: 13.9 % (ref 11.5–15.5)
WBC: 8 10*3/uL (ref 4.0–10.5)
nRBC: 0 % (ref 0.0–0.2)

## 2019-08-21 LAB — BASIC METABOLIC PANEL
Anion gap: 13 (ref 5–15)
BUN: 16 mg/dL (ref 6–20)
CO2: 25 mmol/L (ref 22–32)
Calcium: 9 mg/dL (ref 8.9–10.3)
Chloride: 98 mmol/L (ref 98–111)
Creatinine, Ser: 0.92 mg/dL (ref 0.61–1.24)
GFR calc Af Amer: >60 mL/min (ref >60)
GFR calc non Af Amer: >60 mL/min (ref >60)
Glucose, Bld: 115 mg/dL — ABNORMAL HIGH (ref 70–99)
Potassium: 3.8 mmol/L (ref 3.5–5.1)
Sodium: 136 mmol/L (ref 135–145)

## 2019-08-21 LAB — HIV ANTIBODY (ROUTINE TESTING W REFLEX): HIV Screen 4th Generation wRfx: NONREACTIVE

## 2019-08-21 LAB — FIBRIN DERIVATIVES D-DIMER (ARMC ONLY): Fibrin derivatives D-dimer (ARMC): 1542.5 ng/mL (FEU) — ABNORMAL HIGH (ref 0.00–499.00)

## 2019-08-21 LAB — HEPATITIS B SURFACE ANTIGEN: Hepatitis B Surface Ag: NONREACTIVE

## 2019-08-21 LAB — C-REACTIVE PROTEIN: CRP: 12.5 mg/dL — ABNORMAL HIGH (ref <1.0)

## 2019-08-21 LAB — PROCALCITONIN: Procalcitonin: <0.1 ng/mL

## 2019-08-21 LAB — LACTIC ACID, PLASMA
Lactic Acid, Venous: 1 mmol/L (ref 0.5–1.9)
Lactic Acid, Venous: 1.7 mmol/L (ref 0.5–1.9)

## 2019-08-21 LAB — LACTATE DEHYDROGENASE: LDH: 272 U/L — ABNORMAL HIGH (ref 98–192)

## 2019-08-21 LAB — FERRITIN: Ferritin: 298 ng/mL (ref 24–336)

## 2019-08-21 LAB — FIBRINOGEN: Fibrinogen: >750 mg/dL — ABNORMAL HIGH (ref 210–475)

## 2019-08-21 LAB — BRAIN NATRIURETIC PEPTIDE: B Natriuretic Peptide: 32 pg/mL (ref 0.0–100.0)

## 2019-08-21 LAB — TROPONIN I (HIGH SENSITIVITY): Troponin I (High Sensitivity): 4 ng/L (ref <18)

## 2019-08-21 LAB — TRIGLYCERIDES: Triglycerides: 150 mg/dL — ABNORMAL HIGH (ref <150)

## 2019-08-21 MED ORDER — SODIUM CHLORIDE 0.9 % IV SOLN
100.0000 mg | Freq: Every day | INTRAVENOUS | Status: AC
  Administered 2019-08-22 – 2019-08-25 (×4): 100 mg via INTRAVENOUS
  Filled 2019-08-21 (×5): qty 20

## 2019-08-21 MED ORDER — IOHEXOL 350 MG/ML SOLN
75.0000 mL | Freq: Once | INTRAVENOUS | Status: AC | PRN
  Administered 2019-08-21: 75 mL via INTRAVENOUS

## 2019-08-21 MED ORDER — ASCORBIC ACID 500 MG PO TABS
500.0000 mg | ORAL_TABLET | Freq: Every day | ORAL | Status: DC
  Administered 2019-08-21 – 2019-08-25 (×5): 500 mg via ORAL
  Filled 2019-08-21 (×5): qty 1

## 2019-08-21 MED ORDER — METHYLPREDNISOLONE SODIUM SUCC 125 MG IJ SOLR
60.0000 mg | Freq: Two times a day (BID) | INTRAMUSCULAR | Status: DC
  Administered 2019-08-22 – 2019-08-23 (×3): 60 mg via INTRAVENOUS
  Filled 2019-08-21 (×3): qty 2

## 2019-08-21 MED ORDER — METHYLPREDNISOLONE SODIUM SUCC 125 MG IJ SOLR
125.0000 mg | Freq: Once | INTRAMUSCULAR | Status: AC
  Administered 2019-08-21: 125 mg via INTRAVENOUS
  Filled 2019-08-21: qty 2

## 2019-08-21 MED ORDER — ENOXAPARIN SODIUM 40 MG/0.4ML ~~LOC~~ SOLN
40.0000 mg | SUBCUTANEOUS | Status: DC
  Administered 2019-08-21: 40 mg via SUBCUTANEOUS
  Filled 2019-08-21: qty 0.4

## 2019-08-21 MED ORDER — ONDANSETRON HCL 4 MG/2ML IJ SOLN: 4.0000 mg | Freq: Three times a day (TID) | INTRAMUSCULAR | Status: DC | PRN

## 2019-08-21 MED ORDER — ZINC SULFATE 220 (50 ZN) MG PO CAPS
220.0000 mg | ORAL_CAPSULE | Freq: Every day | ORAL | Status: DC
  Administered 2019-08-21 – 2019-08-25 (×5): 220 mg via ORAL
  Filled 2019-08-21 (×5): qty 1

## 2019-08-21 MED ORDER — ALBUTEROL SULFATE HFA 108 (90 BASE) MCG/ACT IN AERS
2.0000 | INHALATION_SPRAY | RESPIRATORY_TRACT | Status: DC | PRN
  Filled 2019-08-21: qty 6.7

## 2019-08-21 MED ORDER — SODIUM CHLORIDE 0.9% FLUSH
3.0000 mL | Freq: Once | INTRAVENOUS | Status: AC
  Administered 2019-08-21: 3 mL via INTRAVENOUS

## 2019-08-21 MED ORDER — MORPHINE SULFATE (PF) 2 MG/ML IV SOLN
2.0000 mg | INTRAVENOUS | Status: DC | PRN
  Administered 2019-08-21: 2 mg via INTRAVENOUS

## 2019-08-21 MED ORDER — ACETAMINOPHEN 325 MG PO TABS: 650.0000 mg | ORAL_TABLET | Freq: Four times a day (QID) | ORAL | Status: DC | PRN

## 2019-08-21 MED ORDER — DM-GUAIFENESIN ER 30-600 MG PO TB12
1.0000 | ORAL_TABLET | Freq: Two times a day (BID) | ORAL | Status: DC
  Administered 2019-08-21 – 2019-08-25 (×9): 1 via ORAL
  Filled 2019-08-21 (×10): qty 1

## 2019-08-21 MED ORDER — MORPHINE SULFATE (PF) 2 MG/ML IV SOLN
INTRAVENOUS | Status: AC
  Filled 2019-08-21: qty 1

## 2019-08-21 MED ORDER — OXYCODONE-ACETAMINOPHEN 5-325 MG PO TABS
1.0000 | ORAL_TABLET | ORAL | Status: DC | PRN
  Administered 2019-08-21: 22:00:00 1 via ORAL
  Filled 2019-08-21: qty 1

## 2019-08-21 MED ORDER — IPRATROPIUM BROMIDE HFA 17 MCG/ACT IN AERS
2.0000 | INHALATION_SPRAY | RESPIRATORY_TRACT | Status: DC
  Administered 2019-08-21 – 2019-08-25 (×24): 2 via RESPIRATORY_TRACT
  Filled 2019-08-21: qty 12.9

## 2019-08-21 MED ORDER — SODIUM CHLORIDE 0.9 % IV SOLN
200.0000 mg | Freq: Once | INTRAVENOUS | Status: AC
  Administered 2019-08-21: 200 mg via INTRAVENOUS
  Filled 2019-08-21: qty 40

## 2019-08-21 NOTE — ED Notes (Signed)
Sent a rainbow to lab  ° °

## 2019-08-21 NOTE — Consult Note (Signed)
Remdesivir - Pharmacy Brief Note   A/P:  Remdesivir 200 mg IVPB once followed by 100 mg IVPB daily x 4 days.  No historical documented use of remdesivir was found.   Albina Billet, PharmD, BCPS Clinical Pharmacist 08/21/2019 2:04 PM

## 2019-08-21 NOTE — ED Notes (Signed)
Educated patient on prone positioning and trying to slow breaths

## 2019-08-21 NOTE — ED Provider Notes (Signed)
[facility] Emergency Department Provider Note       Time seen: ----------------------------------------- 12:53 PM on 08/21/2019 -----------------------------------------   I have reviewed the triage vital signs and the nursing notes.  HISTORY Chief Complaint Chest Pain    HPI Alan Nguyen is a 49 y.o. male with a history of obesity who presents to the ED for substernal chest pain and pressure with shortness of breath and sweats for the past 4 days.  Patient denies any cardiac history.  Patient reports testing positive for Covid on the fourth of this month which was 12 days ago.  He describes discomfort 9 out of 10.  Past Medical History:  Diagnosis Date  . Obesity     There are no problems to display for this patient.   Past Surgical History:  Procedure Laterality Date  . TOOTH EXTRACTION      Allergies Patient has no known allergies.  Social History Social History   Tobacco Use  . Smoking status: Never Smoker  . Smokeless tobacco: Never Used  Substance Use Topics  . Alcohol use: Yes    Comment: socially  . Drug use: No    Review of Systems Constitutional: Negative for fever. Cardiovascular: Negative for chest pain. Respiratory: Negative for shortness of breath. Gastrointestinal: Negative for abdominal pain, vomiting and diarrhea. Musculoskeletal: Negative for back pain. Skin: Negative for rash. Neurological: Negative for headaches, focal weakness or numbness.  All systems negative/normal/unremarkable except as stated in the HPI  ____________________________________________   PHYSICAL EXAM:  VITAL SIGNS: ED Triage Vitals  Enc Vitals Group     BP 08/21/19 1112 120/83     Pulse Rate 08/21/19 1112 99     Resp 08/21/19 1112 20     Temp 08/21/19 1112 98.7 F (37.1 C)     Temp Source 08/21/19 1112 Oral     SpO2 08/21/19 1112 (!) 89 %     Weight 08/21/19 1115 280 lb (127 kg)     Height 08/21/19 1115 6' (1.829 m)      Head Circumference --      Peak Flow --      Pain Score 08/21/19 1115 9     Pain Loc --      Pain Edu? --      Excl. in [place]? --     Constitutional: Alert and oriented. Well appearing and in no distress. Eyes: Conjunctivae are normal. Normal extraocular movements. ENT      Head: Normocephalic and atraumatic.      Nose: No congestion/rhinnorhea.      Mouth/Throat: Mucous membranes are moist.      Neck: No stridor. Cardiovascular: Normal rate, regular rhythm. No murmurs, rubs, or gallops. Respiratory: Normal respiratory effort without tachypnea nor retractions. Breath sounds are clear and equal bilaterally. No wheezes/rales/rhonchi. Gastrointestinal: Soft and nontender. Normal bowel sounds Musculoskeletal: Nontender with normal range of motion in extremities. No lower extremity tenderness nor edema. Neurologic:  Normal speech and language. No gross focal neurologic deficits are appreciated.  Skin:  Skin is warm, dry and intact. No rash noted. Psychiatric: Mood and affect are normal. Speech and behavior are normal.  ____________________________________________  EKG: Interpreted by me.  Sinus rhythm with a rate of 97 bpm, normal axis, normal QT  ____________________________________________  ED COURSE:  As part of my medical decision making, I reviewed the following data within the [facility] History obtained from family if available, nursing notes, old chart and ekg, as well as notes from  prior ED visits. Patient presented for COVID-19 symptoms, we will assess with labs and imaging as indicated at this time.   Procedures  Alan Nguyen was evaluated in Emergency Department on 08/21/2019 for the symptoms described in the history of present illness. He was evaluated in the context of the global COVID-19 pandemic, which necessitated consideration that the patient might be at risk for infection with the SARS-CoV-2 virus that causes COVID-19. Institutional protocols  and algorithms that pertain to the evaluation of patients at risk for COVID-19 are in a state of rapid change based on information released by regulatory bodies including the CDC and federal and state organizations. These policies and algorithms were followed during the patient's care in the ED.  ____________________________________________   LABS (pertinent positives/negatives)  Labs Reviewed  BASIC METABOLIC PANEL - Abnormal; Notable for the following components:      Result Value   Glucose, Bld 115 (*)    All other components within normal limits  CBC - Abnormal; Notable for the following components:   RBC 5.84 (*)    MCV 77.2 (*)    MCH 25.0 (*)    Platelets 418 (*)    All other components within normal limits  CULTURE, BLOOD (ROUTINE X 2)  CULTURE, BLOOD (ROUTINE X 2)  LACTIC ACID, PLASMA  LACTIC ACID, PLASMA  FIBRIN DERIVATIVES D-DIMER ([facility] ONLY)  PROCALCITONIN  C-REACTIVE PROTEIN  TROPONIN I (HIGH SENSITIVITY)   CRITICAL CARE Performed by: Ulice Dash   Total critical care time: 15 minutes  Critical care time was exclusive of separately billable procedures and treating other patients.  Critical care was necessary to treat or prevent imminent or life-threatening deterioration.  Critical care was time spent personally by me on the following activities: development of treatment plan with patient and/or surrogate as well as nursing, discussions with consultants, evaluation of patient's response to treatment, examination of patient, obtaining history from patient or surrogate, ordering and performing treatments and interventions, ordering and review of laboratory studies, ordering and review of radiographic studies, pulse oximetry and re-evaluation of patient's condition.  RADIOLOGY Images were viewed by me  Chest x-ray IMPRESSION: Patchy bilateral airspace opacities in a predominantly peripheral distribution. Findings suggestive of multifocal viral pneumonia  in the setting of known COVID-19 infection. ____________________________________________   DIFFERENTIAL DIAGNOSIS   COVID-19, pneumonia, PE, unstable angina, MI, musculoskeletal pain  FINAL ASSESSMENT AND PLAN  COVID-19, hypoxia   Plan: The patient had presented for COVID-19 with mild hypoxia, he was 89% on room air. Patient's labs were unremarkable. Patient's imaging did reveal atypical pattern for COVID-19.  I have ordered IV steroids for him.  Given his hypoxia I will discuss with the hospitalist for admission.   Ulice Dash, MD    Note: This note was generated in part or whole with voice recognition software. Voice recognition is usually quite accurate but there are transcription errors that can and very often do occur. I apologize for any typographical errors that were not detected and corrected.     Emily Filbert, MD 08/21/19 1331

## 2019-08-21 NOTE — ED Notes (Signed)
Pt given meal tray.

## 2019-08-21 NOTE — ED Triage Notes (Addendum)
Pt c/o substernal chest pain/[pressure with SOB and sweats for the past 4 days. Denies cardiac hx. Pt is in NAD on arrival with a steady gait. Pt was tested Positive for Covid 3/4.Alan Nguyen

## 2019-08-21 NOTE — ED Notes (Addendum)
Pt c/o difficulty breathing feeling like he cannot get a deep enough breath. Pt states "when I try to breath in too hard that's when it feels like I am struggling".  Pt O2sat was 88% on 2L of oxygen, this RN increased oxygen to 3L. Pt states it helps, but is still having a hard time breathing, oxygen sat increased to 94%. Will message admitting MD to inform of pt changes.

## 2019-08-21 NOTE — ED Notes (Signed)
Pt given warm blankets.

## 2019-08-21 NOTE — H&P (Addendum)
History and Physical    Alan Nguyen OEU:[id number] DOB: 05/15/1971 DOA: 08/21/2019  Referring MD/NP/PA:   PCP: Patient, No Pcp Per   Patient coming from:  The patient is coming from home.  At baseline, pt is independent for most of ADL.        Chief Complaint: SOB  HPI: Alan Nguyen is a 49 y.o. male without significant past medical history, who presents with shortness breath.  Patient states that he had positive COVID-19 test on 3/4.  He developed shortness of breath in the past 4 days, also has mild dry cough, mild subjective fever and chills.  He also reports chest pain, which is located in the central chest, intermittently, 9 out of 10 in severity, sharp, nonradiating.  Patient has nausea, mild loose stool bowel movement, mild central abdominal tenderness, but no vomiting.  No symptoms of UTI or unilateral weakness.  ED Course: pt was found to have WBC 5.8, troponin 4, electrolytes renal function okay, temperature normal, blood pressure 120/83, oxygen desaturation to 89% on room air, which improved to 95% on 2 L nasal cannula oxygen, heart rate 97 RR 20, chest x-ray showed bilateral patchy infiltration.  CT angiogram was negative for PE.  Patient is admitted to MedSurg bed as inpatient.  Review of Systems:   General: Has subjective fevers, chills, no body weight gain, has poor appetite, has fatigue HEENT: no blurry vision, hearing changes or sore throat Respiratory: has dyspnea, coughing, no wheezing CV: has chest pain, no palpitations GI: no nausea, abdominal pain, diarrhea, no constipation vomiting, GU: no dysuria, burning on urination, increased urinary frequency, hematuria  Ext: no leg edema Neuro: no unilateral weakness, numbness, or tingling, no vision change or hearing loss Skin: no rash, no skin tear. MSK: No muscle spasm, no deformity, no limitation of range of movement in spin Heme: No easy bruising.  Travel history: No recent long distant  travel.  Allergy: No Known Allergies  Past Medical History:  Diagnosis Date  . Obesity     Past Surgical History:  Procedure Laterality Date  . TOOTH EXTRACTION      Social History:  reports that he has never smoked. He has never used smokeless tobacco. He reports current alcohol use. He reports that he does not use drugs.  Family History:  Family History  Problem Relation Age of Onset  . Cancer Father      Prior to Admission medications   Medication Sig Start Date End Date Taking? Authorizing Provider  meloxicam (MOBIC) 15 MG tablet Take 1 tablet (15 mg total) by mouth daily as needed. Patient not taking: Reported on 08/21/2019 01/24/19   Renford Dills, NP    Physical Exam: Vitals:   08/21/19 1410 08/21/19 1415 08/21/19 1430 08/21/19 1500  BP: (!) 128/96  111/75 117/87  Pulse: 88 89 85 87  Resp: (!) 22 (!) 21 (!) 26 (!) 26  Temp:      TempSrc: Oral     SpO2: 96% 94% 95% 94%  Weight:      Height:       General: Not in acute distress HEENT:       Eyes: PERRL, EOMI, no scleral icterus.       ENT: No discharge from the ears and nose, no pharynx injection, no tonsillar enlargement.        Neck: No JVD, no bruit, no mass felt. Heme: No neck lymph node enlargement. Cardiac: S1/S2, RRR, No murmurs, No gallops or rubs. Respiratory: No rales,  wheezing, rhonchi or rubs. GI: Soft, nondistended, nontender, no rebound pain, no organomegaly, BS present. GU: No hematuria Ext: No pitting leg edema bilaterally. 2+DP/PT pulse bilaterally. Musculoskeletal: No joint deformities, No joint redness or warmth, no limitation of ROM in spin. Skin: No rashes.  Neuro: Alert, oriented X3, cranial nerves II-XII grossly intact, moves all extremities normally.  Psych: Patient is not psychotic, no suicidal or hemocidal ideation.  Labs on Admission: I have personally reviewed following labs and imaging studies  CBC: Recent Labs  Lab 08/21/19 1136  WBC 8.0  HGB 14.6  HCT 45.1  MCV 77.2*   PLT 418*   Basic Metabolic Panel: Recent Labs  Lab 08/21/19 1136  NA 136  K 3.8  CL 98  CO2 25  GLUCOSE 115*  BUN 16  CREATININE 0.92  CALCIUM 9.0   GFR: Estimated Creatinine Clearance: 132.3 mL/min (by C-G formula based on SCr of 0.92 mg/dL). Liver Function Tests: No results for input(s): AST, ALT, ALKPHOS, BILITOT, PROT, ALBUMIN in the last 168 hours. No results for input(s): LIPASE, AMYLASE in the last 168 hours. No results for input(s): AMMONIA in the last 168 hours. Coagulation Profile: No results for input(s): INR, PROTIME in the last 168 hours. Cardiac Enzymes: No results for input(s): CKTOTAL, CKMB, CKMBINDEX, TROPONINI in the last 168 hours. BNP (last 3 results) No results for input(s): PROBNP in the last 8760 hours. HbA1C: No results for input(s): HGBA1C in the last 72 hours. CBG: No results for input(s): GLUCAP in the last 168 hours. Lipid Profile: No results for input(s): CHOL, HDL, LDLCALC, TRIG, CHOLHDL, LDLDIRECT in the last 72 hours. Thyroid Function Tests: No results for input(s): TSH, T4TOTAL, FREET4, T3FREE, THYROIDAB in the last 72 hours. Anemia Panel: No results for input(s): VITAMINB12, FOLATE, FERRITIN, TIBC, IRON, RETICCTPCT in the last 72 hours. Urine analysis:    Component Value Date/Time   COLORURINE YELLOW (A) 12/30/2015 1646   APPEARANCEUR CLEAR (A) 12/30/2015 1646   LABSPEC 1.018 12/30/2015 1646   PHURINE 5.0 12/30/2015 1646   GLUCOSEU NEGATIVE 12/30/2015 1646   HGBUR NEGATIVE 12/30/2015 1646   BILIRUBINUR NEGATIVE 12/30/2015 1646   KETONESUR NEGATIVE 12/30/2015 1646   PROTEINUR NEGATIVE 12/30/2015 1646   UROBILINOGEN 0.2 08/15/2007 0831   NITRITE NEGATIVE 12/30/2015 1646   LEUKOCYTESUR NEGATIVE 12/30/2015 1646   Sepsis Labs: @LABRCNTIP (procalcitonin:4,lacticidven:4) )No results found for this or any previous visit (from the past 240 hour(s)).   Radiological Exams on Admission: DG Chest 2 View  Result Date:  08/21/2019 CLINICAL DATA:  Shortness of breath, COVID-19 positive EXAM: CHEST - 2 VIEW COMPARISON:  08/06/2008 FINDINGS: The heart size and mediastinal contours are within normal limits. Lung volumes are low. Patchy bilateral airspace opacities in a predominantly peripheral distribution. Prominent interstitial markings throughout both lungs. No pleural effusion or pneumothorax. IMPRESSION: Patchy bilateral airspace opacities in a predominantly peripheral distribution. Findings suggestive of multifocal viral pneumonia in the setting of known COVID-19 infection. Electronically Signed   By: 10/06/2008 D.O.   On: 08/21/2019 12:14   CT ANGIO CHEST PE W OR WO CONTRAST  Result Date: 08/21/2019 CLINICAL DATA:  Shortness of breath.  COVID-19 positive EXAM: CT ANGIOGRAPHY CHEST WITH CONTRAST TECHNIQUE: Multidetector CT imaging of the chest was performed using the standard protocol during bolus administration of intravenous contrast. Multiplanar CT image reconstructions and MIPs were obtained to evaluate the vascular anatomy. CONTRAST:  70mL OMNIPAQUE IOHEXOL 350 MG/ML SOLN COMPARISON:  Chest radiograph August 21, 2019 FINDINGS: Cardiovascular: There is no demonstrable pulmonary  embolus. There is no thoracic aortic aneurysm or dissection. The visualized great vessels appear unremarkable. There is no pericardial effusion or pericardial thickening. Mediastinum/Nodes: Visualized thyroid appears unremarkable. There are scattered subcentimeter mediastinal lymph nodes. There is a lymph node in the right hilum measuring 1.3 x 1.1 cm, mildly enlarged. There is a left subcarinal lymph node measuring 1.3 x 1.1 cm. No other lymph node prominence evident. There is a small hiatal hernia. Lungs/Pleura: There is airspace opacity throughout the lungs involving virtually all lobes in segments to varying degrees. Most of this opacity is ill-defined, although there are areas of scattered consolidation in both lower lobes and inferior  lingular regions. No pleural effusions are appreciable. Upper Abdomen: There is incomplete visualization of a cyst in the upper pole of the left kidney measuring 1.4 x 1.3 cm. Visualized upper abdominal structures otherwise appear unremarkable. Musculoskeletal: There are no blastic or lytic bone lesions. No evident chest wall lesion. Review of the MIP images confirms the above findings. IMPRESSION: 1. No demonstrable pulmonary embolus. No thoracic aortic aneurysm or dissection. 2. Widespread multifocal pneumonia involving most lobes and segments to varying degrees. 3. Mildly prominent right hilar and left subcarinal lymph nodes, likely of reactive etiology given the parenchymal lung changes. Multiple subcentimeter lymph nodes are seen scattered throughout the mediastinum as well. 4.  Small hiatal hernia. Electronically Signed   By: Lowella Grip III M.D.   On: 08/21/2019 15:13     EKG: Independently reviewed.  Sinus rhythm, QTC 469, LAE, nonspecific T wave change   Assessment/Plan Principal Problem:   Acute respiratory disease due to COVID-19 virus Active Problems:   Chest pain   Acute respiratory disease due to COVID-19 virus: Oxygen desaturation to 89% on room air, need 2 L nasal cannula oxygen with saturation 95%.  Chest x-ray showed bilateral patchy effusion.  CT angiogram was negative for PE.  Patient has acute respiratory failure with hypoxia due COVID-19 infection.  -will admit to med-surg bed as inpt -Remdesivir per pharm -Solumedrol 60 mg bid -vitamin C, zinc.  -Bronchodilators -PRN Mucinex for cough -f/u Blood culture -D-dimer, BNP,Trop, LFT, CRP, LDH, Procalcitonin, Ferritin, fibinogen, TG, Hep B SAg, HIV ab -Daily CRP, Ferritin, D-dimer, -Will ask the patient to maintain an awake prone position for 16+ hours a day, if possible, with a minimum of 2-3 hours at a time -Will attempt to maintain euvolemia to a net negative fluid status -IF patient deteriorates, will consult PCCM  and ID  Chest pain: Trop negative. CAT of chest is negative for PE. Likely due to viral pneumonia -As needed Percocet and morphine for pain    Inpatient status:  # Patient requires inpatient status due to high intensity of service, high risk for further deterioration and high frequency of surveillance required.  I certify that at the point of admission it is my clinical judgment that the patient will require inpatient hospital care spanning beyond 2 midnights from the point of admission.  . Now patient has presenting with acute respiratory disease due to COVID-19 virus and chest pain . The initial radiographic and laboratory data are worrisome because of bilateral patchy infiltrates on chest x-ray . Current medical needs: please see my assessment and plan . Predictability of an adverse outcome (risk): Patient has multiple comorbidities as listed above. Now presents with acute respiratory disease due to COVID-19 virus and chest pain. Pt has oxygen desaturation and new oxygen requirement. Patient's presentation is highly complicated.  Patient is at high risk of deteriorating.  Will need to be treated in hospital for at least 2 days.             DVT ppx: SQ Lovenox Code Status: Full code Family Communication: not done, no family member is at bed side.   Disposition Plan:  Anticipate discharge back to previous home environment Consults called:  none Admission status: Med-surg bed for as inpt      Date of Service 08/21/2019    Lorretta Harp [facility]   If 7PM-7AM, please contact night-coverage [email or web address] 08/21/2019, 3:28 PM

## 2019-08-21 NOTE — ED Notes (Signed)
Admit at bedside

## 2019-08-21 NOTE — ED Notes (Signed)
ED TO INPATIENT HANDOFF REPORT  ED Nurse Name and Phone #: Jae Dire [phone]  S Name/Age/Gender Alan Nguyen 49 y.o. male Room/Bed: ED11A/ED11A  Code Status   Code Status: Full Code  Home/SNF/Other Home Patient oriented to: self, place, time and situation Is this baseline? Yes   Triage Complete: Triage complete  Chief Complaint Acute respiratory disease due to COVID-19 virus [U07.1, J06.9]  Triage Note Pt c/o substernal chest pain/[pressure with SOB and sweats for the past 4 days. Denies cardiac hx. Pt is in NAD on arrival with a steady gait. Pt was tested Positive for Covid 3/4.Marland Kitchen    Allergies No Known Allergies  Level of Care/Admitting Diagnosis ED Disposition    ED Disposition Condition Comment   Admit  Hospital Area: [facility] [[id number]]  Level of Care: Med-Surg [16]  Covid Evaluation: Confirmed COVID Positive  Diagnosis: Acute respiratory disease due to COVID-19 virus [[id number]]  Admitting Physician: Lorretta Harp [[id number]]  Attending Physician: Lorretta Harp [[id number]]  Estimated length of stay: past midnight tomorrow  Certification:: I certify this patient will need inpatient services for at least 2 midnights       B Medical/Surgery History Past Medical History:  Diagnosis Date  . Obesity    Past Surgical History:  Procedure Laterality Date  . TOOTH EXTRACTION       A IV Location/Drains/Wounds Patient Lines/Drains/Airways Status   Active Line/Drains/Airways    Name:   Placement date:   Placement time:   Site:   Days:   Peripheral IV 08/21/19 Left Antecubital   08/21/19    1350    Antecubital   less than 1   Peripheral IV 08/21/19 Right Hand   08/21/19    1419    Hand   less than 1          Intake/Output Last 24 hours No intake or output data in the 24 hours ending 08/21/19 1912  Labs/Imaging Results for orders placed or performed during the hospital encounter of 08/21/19 (from the past 48 hour(s))  Brain natriuretic peptide      Status: None   Collection Time: 08/21/19 11:35 AM  Result Value Ref Range   B Natriuretic Peptide 32.0 0.0 - 100.0 pg/mL    Comment: Performed at [facility], [address] [place], [place] [address]  Basic metabolic panel     Status: Abnormal   Collection Time: 08/21/19 11:36 AM  Result Value Ref Range   Sodium 136 135 - 145 mmol/L   Potassium 3.8 3.5 - 5.1 mmol/L   Chloride 98 98 - 111 mmol/L   CO2 25 22 - 32 mmol/L   Glucose, Bld 115 (H) 70 - 99 mg/dL    Comment: Glucose reference range applies only to samples taken after fasting for at least 8 hours.   BUN 16 6 - 20 mg/dL   Creatinine, Ser 9.14 0.61 - 1.24 mg/dL   Calcium 9.0 8.9 - 78.2 mg/dL   GFR calc non Af Amer >60 >60 mL/min   GFR calc Af Amer >60 >60 mL/min   Anion gap 13 5 - 15    Comment: Performed at [facility], [address] [place], [place] [address]  CBC     Status: Abnormal   Collection Time: 08/21/19 11:36 AM  Result Value Ref Range   WBC 8.0 4.0 - 10.5 K/uL   RBC 5.84 (H) 4.22 - 5.81 MIL/uL   Hemoglobin 14.6 13.0 - 17.0 g/dL   HCT 30.8 65.7 -  52.0 %   MCV 77.2 (L) 80.0 - 100.0 fL   MCH 25.0 (L) 26.0 - 34.0 pg   MCHC 32.4 30.0 - 36.0 g/dL   RDW 13.9 11.5 - 15.5 %   Platelets 418 (H) 150 - 400 K/uL   nRBC 0.0 0.0 - 0.2 %    Comment: Performed at [facility], [address] [place], [place] [address]  Lactic acid, plasma     Status: None   Collection Time: 08/21/19  1:52 PM  Result Value Ref Range   Lactic Acid, Venous 1.0 0.5 - 1.9 mmol/L    Comment: Performed at [facility], [address] [place], [place] [address]  Fibrin derivatives D-Dimer     Status: Abnormal   Collection Time: 08/21/19  1:52 PM  Result Value Ref Range   Fibrin derivatives D-dimer ([facility]) 1,542.50 (H) 0.00 - 499.00 ng/mL (FEU)    Comment: (NOTE) <> Exclusion of Venous Thromboembolism (VTE) - OUTPATIENT ONLY   (Emergency Department or [place])   0-499 ng/ml (FEU): With a low to  intermediate pretest probability                      for VTE this test result excludes the diagnosis                      of VTE.   >499 ng/ml (FEU) : VTE not excluded; additional work up for VTE is                      required. <> Testing on Inpatients and Evaluation of Disseminated Intravascular   Coagulation (DIC) Reference Range:   0-499 ng/ml (FEU) Performed at [facility], [address] [place], [place] [address]   Procalcitonin     Status: None   Collection Time: 08/21/19  1:52 PM  Result Value Ref Range   Procalcitonin <0.10 ng/mL    Comment:        Interpretation: PCT (Procalcitonin) <= 0.5 ng/mL: Systemic infection (sepsis) is not likely. Local bacterial infection is possible. (NOTE)       Sepsis PCT Algorithm           Lower Respiratory Tract                                      Infection PCT Algorithm    ----------------------------     ----------------------------         PCT < 0.25 ng/mL                PCT < 0.10 ng/mL         Strongly encourage             Strongly discourage   discontinuation of antibiotics    initiation of antibiotics    ----------------------------     -----------------------------       PCT 0.25 - 0.50 ng/mL            PCT 0.10 - 0.25 ng/mL               OR       >80% decrease in PCT            Discourage initiation of  antibiotics      Encourage discontinuation           of antibiotics    ----------------------------     -----------------------------         PCT >= 0.50 ng/mL              PCT 0.26 - 0.50 ng/mL               AND        <80% decrease in PCT             Encourage initiation of                                             antibiotics       Encourage continuation           of antibiotics    ----------------------------     -----------------------------        PCT >= 0.50 ng/mL                  PCT > 0.50 ng/mL               AND         increase in PCT                   Strongly encourage                                      initiation of antibiotics    Strongly encourage escalation           of antibiotics                                     -----------------------------                                           PCT <= 0.25 ng/mL                                                 OR                                        > 80% decrease in PCT                                     Discontinue / Do not initiate                                             antibiotics Performed at [facility], [address] [place] [address] [place], [place] [address]   Troponin I (High Sensitivity)     Status: None   Collection Time:  08/21/19  1:52 PM  Result Value Ref Range   Troponin I (High Sensitivity) 4 <18 ng/L    Comment: (NOTE) Elevated high sensitivity troponin I (hsTnI) values and significant  changes across serial measurements may suggest ACS but many other  chronic and acute conditions are known to elevate hsTnI results.  Refer to the "Links" section for chest pain algorithms and additional  guidance. Performed at [facility], [address] [place], [place] [address]   Ferritin     Status: None   Collection Time: 08/21/19  3:09 PM  Result Value Ref Range   Ferritin 298 24 - 336 ng/mL    Comment: Performed at [facility], [address] [place], [place] [address]  Fibrinogen     Status: Abnormal   Collection Time: 08/21/19  3:09 PM  Result Value Ref Range   Fibrinogen >750 (H) 210 - 475 mg/dL    Comment: Performed at [facility], [address] [place], [place] [address]  Lactate dehydrogenase     Status: Abnormal   Collection Time: 08/21/19  3:09 PM  Result Value Ref Range   LDH 272 (H) 98 - 192 U/L    Comment: Performed at [facility], [address] [place], [place] [address]  Triglycerides     Status: Abnormal   Collection Time: 08/21/19  3:09 PM  Result Value Ref Range   Triglycerides 150 (H) <150 mg/dL     Comment: Performed at [facility], [address] [place], [place] [address]   DG Chest 2 View  Result Date: 08/21/2019 CLINICAL DATA:  Shortness of breath, COVID-19 positive EXAM: CHEST - 2 VIEW COMPARISON:  08/06/2008 FINDINGS: The heart size and mediastinal contours are within normal limits. Lung volumes are low. Patchy bilateral airspace opacities in a predominantly peripheral distribution. Prominent interstitial markings throughout both lungs. No pleural effusion or pneumothorax. IMPRESSION: Patchy bilateral airspace opacities in a predominantly peripheral distribution. Findings suggestive of multifocal viral pneumonia in the setting of known COVID-19 infection. Electronically Signed   By: Duanne GuessNicholas  Plundo D.O.   On: 08/21/2019 12:14   CT ANGIO CHEST PE W OR WO CONTRAST  Result Date: 08/21/2019 CLINICAL DATA:  Shortness of breath.  COVID-19 positive EXAM: CT ANGIOGRAPHY CHEST WITH CONTRAST TECHNIQUE: Multidetector CT imaging of the chest was performed using the standard protocol during bolus administration of intravenous contrast. Multiplanar CT image reconstructions and MIPs were obtained to evaluate the vascular anatomy. CONTRAST:  75mL OMNIPAQUE IOHEXOL 350 MG/ML SOLN COMPARISON:  Chest radiograph August 21, 2019 FINDINGS: Cardiovascular: There is no demonstrable pulmonary embolus. There is no thoracic aortic aneurysm or dissection. The visualized great vessels appear unremarkable. There is no pericardial effusion or pericardial thickening. Mediastinum/Nodes: Visualized thyroid appears unremarkable. There are scattered subcentimeter mediastinal lymph nodes. There is a lymph node in the right hilum measuring 1.3 x 1.1 cm, mildly enlarged. There is a left subcarinal lymph node measuring 1.3 x 1.1 cm. No other lymph node prominence evident. There is a small hiatal hernia. Lungs/Pleura: There is airspace opacity throughout the lungs involving virtually all lobes in segments to varying  degrees. Most of this opacity is ill-defined, although there are areas of scattered consolidation in both lower lobes and inferior lingular regions. No pleural effusions are appreciable. Upper Abdomen: There is incomplete visualization of a cyst in the upper pole of the left kidney measuring 1.4 x 1.3 cm. Visualized upper abdominal structures otherwise appear unremarkable. Musculoskeletal: There are no blastic or lytic  bone lesions. No evident chest wall lesion. Review of the MIP images confirms the above findings. IMPRESSION: 1. No demonstrable pulmonary embolus. No thoracic aortic aneurysm or dissection. 2. Widespread multifocal pneumonia involving most lobes and segments to varying degrees. 3. Mildly prominent right hilar and left subcarinal lymph nodes, likely of reactive etiology given the parenchymal lung changes. Multiple subcentimeter lymph nodes are seen scattered throughout the mediastinum as well. 4.  Small hiatal hernia. Electronically Signed   By: Bretta Bang III M.D.   On: 08/21/2019 15:13    Pending Labs Unresulted Labs (From admission, onward)    Start     Ordered   08/22/19 0500  CBC with Differential/Platelet  Daily,   STAT     08/21/19 1427   08/22/19 0500  C-reactive protein  Daily,   STAT     08/21/19 1427   08/22/19 0500  Fibrin derivatives D-Dimer ([facility] only)  Daily,   STAT     08/21/19 1427   08/22/19 0500  Ferritin  Daily,   STAT     08/21/19 1427   08/21/19 1428  Hepatitis B surface antigen  Once,   STAT     08/21/19 1427   08/21/19 1426  HIV Antibody (routine testing w rflx)  (HIV Antibody (Routine testing w reflex) panel)  Once,   STAT     08/21/19 1427   08/21/19 1256  Lactic acid, plasma  Now then every 2 hours,   STAT     08/21/19 1255   08/21/19 1256  Blood Culture (routine x 2)  BLOOD CULTURE X 2,   STAT     08/21/19 1255   08/21/19 1256  C-reactive protein  Once,   STAT     08/21/19 1255          Vitals/Pain Today's Vitals   08/21/19 1800  08/21/19 1815 08/21/19 1830 08/21/19 1900  BP: 117/81  126/82 (!) 139/103  Pulse: 95 (!) 102 (!) 102 (!) 108  Resp:  (!) 34 (!) 24   Temp:      TempSrc:      SpO2: 91% 92% (!) 88% 90%  Weight:      Height:      PainSc:        Isolation Precautions Airborne and Contact precautions  Medications Medications  ascorbic acid (VITAMIN C) tablet 500 mg (500 mg Oral Given 08/21/19 1633)  zinc sulfate capsule 220 mg (220 mg Oral Given 08/21/19 1632)  ipratropium (ATROVENT HFA) inhaler 2 puff (2 puffs Inhalation Given 08/21/19 1833)  albuterol (VENTOLIN HFA) 108 (90 Base) MCG/ACT inhaler 2 puff (has no administration in time range)  dextromethorphan-guaiFENesin (MUCINEX DM) 30-600 MG per 12 hr tablet 1 tablet (1 tablet Oral Given 08/21/19 1632)  morphine 2 MG/ML injection 2 mg (2 mg Intravenous Given 08/21/19 1413)  oxyCODONE-acetaminophen (PERCOCET/ROXICET) 5-325 MG per tablet 1 tablet (has no administration in time range)  remdesivir 200 mg in sodium chloride 0.9% 250 mL IVPB (0 mg Intravenous Stopped 08/21/19 1707)    Followed by  remdesivir 100 mg in sodium chloride 0.9 % 100 mL IVPB (has no administration in time range)  ondansetron (ZOFRAN) injection 4 mg (has no administration in time range)  acetaminophen (TYLENOL) tablet 650 mg (has no administration in time range)  enoxaparin (LOVENOX) injection 40 mg (40 mg Subcutaneous Given 08/21/19 1632)  methylPREDNISolone sodium succinate (SOLU-MEDROL) 125 mg/2 mL injection 60 mg (has no administration in time range)  sodium chloride flush (NS) 0.9 % injection 3  mL (3 mLs Intravenous Given 08/21/19 1416)  methylPREDNISolone sodium succinate (SOLU-MEDROL) 125 mg/2 mL injection 125 mg (125 mg Intravenous Given 08/21/19 1411)  iohexol (OMNIPAQUE) 350 MG/ML injection 75 mL (75 mLs Intravenous Contrast Given 08/21/19 1442)    Mobility walks with person assist Low fall risk   Focused Assessments Pulmonary Assessment Handoff:  Lung sounds:   O2  Device: Nasal Cannula O2 Flow Rate (L/min): 3 L/min      R Recommendations: See Admitting Provider Note  Report given to:   Additional Notes:

## 2019-08-22 LAB — CBC WITH DIFFERENTIAL/PLATELET
Abs Immature Granulocytes: 0.02 10*3/uL (ref 0.00–0.07)
Basophils Absolute: 0 10*3/uL (ref 0.0–0.1)
Basophils Relative: 0 %
Eosinophils Absolute: 0 10*3/uL (ref 0.0–0.5)
Eosinophils Relative: 0 %
HCT: 42.9 % (ref 39.0–52.0)
Hemoglobin: 13.4 g/dL (ref 13.0–17.0)
Immature Granulocytes: 0 %
Lymphocytes Relative: 24 %
Lymphs Abs: 1.2 10*3/uL (ref 0.7–4.0)
MCH: 25 pg — ABNORMAL LOW (ref 26.0–34.0)
MCHC: 31.2 g/dL (ref 30.0–36.0)
MCV: 80.2 fL (ref 80.0–100.0)
Monocytes Absolute: 0.1 10*3/uL (ref 0.1–1.0)
Monocytes Relative: 2 %
Neutro Abs: 3.6 10*3/uL (ref 1.7–7.7)
Neutrophils Relative %: 74 %
Platelets: 502 10*3/uL — ABNORMAL HIGH (ref 150–400)
RBC: 5.35 MIL/uL (ref 4.22–5.81)
RDW: 13.7 % (ref 11.5–15.5)
Smear Review: NORMAL
WBC: 4.9 10*3/uL (ref 4.0–10.5)
nRBC: 0 % (ref 0.0–0.2)

## 2019-08-22 LAB — FIBRIN DERIVATIVES D-DIMER (ARMC ONLY): Fibrin derivatives D-dimer (ARMC): 1313.06 ng/mL (FEU) — ABNORMAL HIGH (ref 0.00–499.00)

## 2019-08-22 LAB — FERRITIN: Ferritin: 257 ng/mL (ref 24–336)

## 2019-08-22 LAB — C-REACTIVE PROTEIN: CRP: 14.2 mg/dL — ABNORMAL HIGH (ref <1.0)

## 2019-08-22 MED ORDER — ENSURE ENLIVE PO LIQD
237.0000 mL | Freq: Three times a day (TID) | ORAL | Status: DC
  Administered 2019-08-22 – 2019-08-25 (×9): 237 mL via ORAL

## 2019-08-22 MED ORDER — ENOXAPARIN SODIUM 80 MG/0.8ML ~~LOC~~ SOLN
0.5000 mg/kg | SUBCUTANEOUS | Status: DC
  Administered 2019-08-22 – 2019-08-24 (×3): 65 mg via SUBCUTANEOUS
  Filled 2019-08-22 (×4): qty 0.8

## 2019-08-22 NOTE — Progress Notes (Signed)
Initial Nutrition Assessment  RD working remotely.  DOCUMENTATION CODES:   Obesity unspecified  INTERVENTION:  Recommend liberalizing diet to regular.  Provide Ensure Enlive po TID, each supplement provides 350 kcal and 20 grams of protein. Patient prefers vanilla.  Encouraged adequate intake of protein at meals.  NUTRITION DIAGNOSIS:   Increased nutrient needs related to catabolic illness(COVID-19) as evidenced by estimated needs.  GOAL:   Patient will meet greater than or equal to 90% of their needs  MONITOR:   PO intake, Supplement acceptance, Labs, Weight trends, I & O's  REASON FOR ASSESSMENT:   Malnutrition Screening Tool    ASSESSMENT:   49 year old male with no significant PMHx admitted with acute respiratory disease due to COVID-19 virus.   Patient currently on airborne/contact precautions for COVID. Spoke with patient over the phone. He reports his appetite is decreased from normal but he is still trying to eat well at meals. He is eating a little over 50% of his meals, which is decreased from his usual intake. Patient reports he has been ordering fruit cups. Discussed catabolic nature of COVID-19 and increased calorie/protein needs. Patient is amenable to drinking ONS to help meet calorie/protein needs. He prefers vanilla flavor.  Patient reports his UBW was 283 lbs. He is currently documented to be 127 kg (280 lbs). He has lost 3 lbs (1.1% body weight) over the past 2 weeks, which is not significant for time frame.  Medications reviewed and include: vitamin C 500 mg daily, Solu-Medrol 60 mg Q12hrs IV, zinc sulfate 220 mg daily, remdesivir.  Labs reviewed.  Unable to determine if patient meets criteria for malnutrition at this time.  NUTRITION - FOCUSED PHYSICAL EXAM:  Unable to complete at this time.  Diet Order:   Diet Order            Diet Heart Room service appropriate? Yes; Fluid consistency: Thin  Diet effective now             EDUCATION  NEEDS:   Education needs have been addressed  Skin:  Skin Assessment: Reviewed RN Assessment  Last BM:  Unknown  Height:   Ht Readings from Last 1 Encounters:  08/21/19 6' (1.829 m)   Weight:   Wt Readings from Last 1 Encounters:  08/21/19 127 kg   BMI:  Body mass index is 37.97 kg/m.  Estimated Nutritional Needs:   Kcal:  2400-2600  Protein:  130-140 grams  Fluid:  >/= 2 L/day  Felix Pacini, MS, RD, LDN Pager number available on Amion

## 2019-08-22 NOTE — Progress Notes (Signed)
[facility] Progress Note  Patient: Alan Nguyen    ZOX:[id number]  DOA: 08/21/2019     Date of Service: the patient was seen and examined on 08/22/2019  Chief Complaint  Patient presents with  . Chest Pain   Brief hospital course: Alan Nguyen is a 49 y.o. male without significant past medical history, who presents with shortness breath.  Patient states that he had positive COVID-19 test on 3/4.  He developed shortness of breath in the past 4 days, also has mild dry cough, mild subjective fever and chills.  He also reports chest pain, which is located in the central chest, intermittently, 9 out of 10 in severity, sharp, nonradiating.  Patient has nausea, mild loose stool bowel movement, mild central abdominal tenderness, but no vomiting.  No symptoms of UTI or unilateral weakness.  ED Course: pt was found to have WBC 5.8, troponin 4, electrolytes renal function okay, temperature normal, blood pressure 120/83, oxygen desaturation to 89% on room air, which improved to 95% on 2 L nasal cannula oxygen, heart rate 97 RR 20, chest x-ray showed bilateral patchy infiltration.  CT angiogram was negative for PE.  Patient is admitted to MedSurg bed as inpatient  Currently further plan is to continue supplemental oxygen, remdesivir and IV Solu-Medrol  Assessment and Plan:  Principal Problem:   Acute respiratory disease due to COVID-19 virus Active Problems:   Chest pain   Acute respiratory disease due to COVID-19 virus: Oxygen desaturation to 89% on room air, need 2 L nasal cannula oxygen with saturation 95%.  Chest x-ray showed bilateral patchy effusion.  CT angiogram was negative for PE. -3/16 =>3/20 Remdesivir per pharm -Solumedrol 60 mg bid, will gradually taper as per response -vitamin C, and zinc.  -Bronchodilators -PRN Mucinex for cough -f/u Blood culture, NGTD -D-dimer 1313.06 , BNP wnl,Trop negative x 1,CRP 14.2, LDH 272, Procalcitonin, Ferritin 257 wnl,  fibinogen >750, TG 150, Hep B Sag NR, HIV ab NR -Daily CRP, Ferritin, D-dimer, LFTs -Will ask the patient to maintain an awake prone position for 16+ hours a day, if possible, with a minimum of 2-3 hours at a time -Will attempt to maintain euvolemia to a net negative fluid status -IF patient deteriorates, will consult PCCM and ID  Chest pain: Trop negative. CAT of chest is negative for PE. Likely due to viral pneumonia -As needed Percocet and morphine for pain   Body mass index is 37.97 kg/m.  Nutrition Problem: Increased nutrient needs Etiology: catabolic illness(COVID-19) Interventions: Interventions: Ensure Enlive (each supplement provides 350kcal and 20 grams of protein), Liberalize Diet, Education    Diet: Regular DVT Prophylaxis: Subcutaneous Lovenox   Advance goals of care discussion: Full code  Family Communication: family was not present at bedside, at the time of interview.  Discussed with patient's wife over the phone 08/22/2019, all questions and concerns answered. The pt provided permission to discuss medical plan with the family. Opportunity was given to ask question and all questions were answered satisfactorily.   Disposition:  Pt is from home, admitted with COVID-19 pneumonia, still has hypoxia, which precludes a safe discharge. Discharge to home, when oxygen will be weaned off vs improvement in the symptoms, still on IV remdesivir.  Subjective: Patient was seen and examined at bedside in the morning, feels improvement in the shortness of breath but is still requires oxygen 8 L via nasal cannula. Patient denies any chest pain or palpitations, no abdominal pain and no nausea vomiting or diarrhea. Patient was requesting  to talk to his wife for updates.  Physical Exam: General:  alert oriented to time, place, and person.  Appear in mild distress, affect appropriate Eyes: PERRLA ENT: Oral Mucosa Clear, moist  Neck: no JVD,  Cardiovascular: S1 and S2 Present, no  Murmur,  Respiratory: good respiratory effort, Bilateral Air entry equal and Decreased, b/l Crackles, mild wheezes Abdomen: Bowel Sound present, Soft and no tenderness,  Skin: No rashes Extremities: No pedal edema, no calf tenderness Neurologic: without any new focal findings Gait not checked due to patient safety concerns  Vitals:   08/21/19 2157 08/21/19 2200 08/22/19 0040 08/22/19 0850  BP: (!) 149/84  (!) 134/97 122/80  Pulse: (!) 102 (!) 103 100   Resp: (!) 25  18 18   Temp: 99.9 F (37.7 C)  98.7 F (37.1 C)   TempSrc: Oral     SpO2: 93%  (!) 89% 92%  Weight:      Height:       No intake or output data in the 24 hours ending 08/22/19 1439 Filed Weights   08/21/19 1115  Weight: 127 kg    Data Reviewed: I have personally reviewed and interpreted daily labs, tele strips, imagings as discussed above. I reviewed all nursing notes, pharmacy notes, vitals, pertinent old records I have discussed plan of care as described above with RN and patient/family.  CBC: Recent Labs  Lab 08/21/19 1136 08/22/19 0447  WBC 8.0 4.9  NEUTROABS  --  3.6  HGB 14.6 13.4  HCT 45.1 42.9  MCV 77.2* 80.2  PLT 418* 973*   Basic Metabolic Panel: Recent Labs  Lab 08/21/19 1136  NA 136  K 3.8  CL 98  CO2 25  GLUCOSE 115*  BUN 16  CREATININE 0.92  CALCIUM 9.0    Studies: CT ANGIO CHEST PE W OR WO CONTRAST  Result Date: 08/21/2019 CLINICAL DATA:  Shortness of breath.  COVID-19 positive EXAM: CT ANGIOGRAPHY CHEST WITH CONTRAST TECHNIQUE: Multidetector CT imaging of the chest was performed using the standard protocol during bolus administration of intravenous contrast. Multiplanar CT image reconstructions and MIPs were obtained to evaluate the vascular anatomy. CONTRAST:  82mL OMNIPAQUE IOHEXOL 350 MG/ML SOLN COMPARISON:  Chest radiograph August 21, 2019 FINDINGS: Cardiovascular: There is no demonstrable pulmonary embolus. There is no thoracic aortic aneurysm or dissection. The visualized  great vessels appear unremarkable. There is no pericardial effusion or pericardial thickening. Mediastinum/Nodes: Visualized thyroid appears unremarkable. There are scattered subcentimeter mediastinal lymph nodes. There is a lymph node in the right hilum measuring 1.3 x 1.1 cm, mildly enlarged. There is a left subcarinal lymph node measuring 1.3 x 1.1 cm. No other lymph node prominence evident. There is a small hiatal hernia. Lungs/Pleura: There is airspace opacity throughout the lungs involving virtually all lobes in segments to varying degrees. Most of this opacity is ill-defined, although there are areas of scattered consolidation in both lower lobes and inferior lingular regions. No pleural effusions are appreciable. Upper Abdomen: There is incomplete visualization of a cyst in the upper pole of the left kidney measuring 1.4 x 1.3 cm. Visualized upper abdominal structures otherwise appear unremarkable. Musculoskeletal: There are no blastic or lytic bone lesions. No evident chest wall lesion. Review of the MIP images confirms the above findings. IMPRESSION: 1. No demonstrable pulmonary embolus. No thoracic aortic aneurysm or dissection. 2. Widespread multifocal pneumonia involving most lobes and segments to varying degrees. 3. Mildly prominent right hilar and left subcarinal lymph nodes, likely of reactive etiology given the  parenchymal lung changes. Multiple subcentimeter lymph nodes are seen scattered throughout the mediastinum as well. 4.  Small hiatal hernia. Electronically Signed   By: Bretta Bang III M.D.   On: 08/21/2019 15:13    Scheduled Meds: . vitamin C  500 mg Oral Daily  . dextromethorphan-guaiFENesin  1 tablet Oral BID  . enoxaparin (LOVENOX) injection  0.5 mg/kg Subcutaneous Q24H  . feeding supplement (ENSURE ENLIVE)  237 mL Oral TID BM  . ipratropium  2 puff Inhalation Q4H  . methylPREDNISolone (SOLU-MEDROL) injection  60 mg Intravenous Q12H  . zinc sulfate  220 mg Oral Daily    Continuous Infusions: . remdesivir 100 mg in NS 100 mL 100 mg (08/22/19 0858)   PRN Meds: acetaminophen, albuterol, morphine injection, ondansetron (ZOFRAN) IV, oxyCODONE-acetaminophen  Time spent: 35 minutes  Author: Gillis Santa. MD [facility] 08/22/2019 2:39 PM  To reach On-call, see care teams to locate the attending and reach out to them via [email or web address]. If 7PM-7AM, please contact night-coverage If you still have difficulty reaching the attending provider, please page the [facility] (Director on Call) for [facility] on amion for assistance.

## 2019-08-23 LAB — CBC WITH DIFFERENTIAL/PLATELET
Abs Immature Granulocytes: 0.07 10*3/uL (ref 0.00–0.07)
Basophils Absolute: 0 10*3/uL (ref 0.0–0.1)
Basophils Relative: 0 %
Eosinophils Absolute: 0 10*3/uL (ref 0.0–0.5)
Eosinophils Relative: 0 %
HCT: 42.5 % (ref 39.0–52.0)
Hemoglobin: 13.5 g/dL (ref 13.0–17.0)
Immature Granulocytes: 1 %
Lymphocytes Relative: 9 %
Lymphs Abs: 1.4 10*3/uL (ref 0.7–4.0)
MCH: 25.3 pg — ABNORMAL LOW (ref 26.0–34.0)
MCHC: 31.8 g/dL (ref 30.0–36.0)
MCV: 79.7 fL — ABNORMAL LOW (ref 80.0–100.0)
Monocytes Absolute: 0.6 10*3/uL (ref 0.1–1.0)
Monocytes Relative: 4 %
Neutro Abs: 13 10*3/uL — ABNORMAL HIGH (ref 1.7–7.7)
Neutrophils Relative %: 86 %
Platelets: 569 10*3/uL — ABNORMAL HIGH (ref 150–400)
RBC: 5.33 MIL/uL (ref 4.22–5.81)
RDW: 13.9 % (ref 11.5–15.5)
WBC: 15.1 10*3/uL — ABNORMAL HIGH (ref 4.0–10.5)
nRBC: 0 % (ref 0.0–0.2)

## 2019-08-23 LAB — HEPATIC FUNCTION PANEL
ALT: 52 U/L — ABNORMAL HIGH (ref 0–44)
AST: 34 U/L (ref 15–41)
Albumin: 3.2 g/dL — ABNORMAL LOW (ref 3.5–5.0)
Alkaline Phosphatase: 62 U/L (ref 38–126)
Bilirubin, Direct: <0.1 mg/dL (ref 0.0–0.2)
Total Bilirubin: 0.7 mg/dL (ref 0.3–1.2)
Total Protein: 7.9 g/dL (ref 6.5–8.1)

## 2019-08-23 LAB — BASIC METABOLIC PANEL
Anion gap: 7 (ref 5–15)
BUN: 21 mg/dL — ABNORMAL HIGH (ref 6–20)
CO2: 29 mmol/L (ref 22–32)
Calcium: 9.1 mg/dL (ref 8.9–10.3)
Chloride: 104 mmol/L (ref 98–111)
Creatinine, Ser: 0.93 mg/dL (ref 0.61–1.24)
GFR calc Af Amer: >60 mL/min (ref >60)
GFR calc non Af Amer: >60 mL/min (ref >60)
Glucose, Bld: 154 mg/dL — ABNORMAL HIGH (ref 70–99)
Potassium: 4.7 mmol/L (ref 3.5–5.1)
Sodium: 140 mmol/L (ref 135–145)

## 2019-08-23 LAB — C-REACTIVE PROTEIN: CRP: 7 mg/dL — ABNORMAL HIGH (ref <1.0)

## 2019-08-23 LAB — FERRITIN: Ferritin: 251 ng/mL (ref 24–336)

## 2019-08-23 LAB — FIBRIN DERIVATIVES D-DIMER (ARMC ONLY): Fibrin derivatives D-dimer (ARMC): 990.98 ng/mL (FEU) — ABNORMAL HIGH (ref 0.00–499.00)

## 2019-08-23 LAB — MAGNESIUM: Magnesium: 2.4 mg/dL (ref 1.7–2.4)

## 2019-08-23 LAB — PHOSPHORUS: Phosphorus: 3.6 mg/dL (ref 2.5–4.6)

## 2019-08-23 MED ORDER — DEXAMETHASONE 4 MG PO TABS
6.0000 mg | ORAL_TABLET | Freq: Every day | ORAL | Status: DC
  Administered 2019-08-24 – 2019-08-25 (×2): 6 mg via ORAL
  Filled 2019-08-23 (×2): qty 2

## 2019-08-23 MED ORDER — METHYLPREDNISOLONE SODIUM SUCC 40 MG IJ SOLR
40.0000 mg | Freq: Two times a day (BID) | INTRAMUSCULAR | Status: AC
  Administered 2019-08-23: 17:00:00 40 mg via INTRAVENOUS
  Filled 2019-08-23: qty 1

## 2019-08-23 NOTE — Progress Notes (Signed)
[facility] Progress Note  Patient: Alan Nguyen    YPP:[id number]  DOA: 08/21/2019     Date of Service: the patient was seen and examined on 08/23/2019  Chief Complaint  Patient presents with  . Chest Pain   Brief hospital course: Rolin Deneal Dross is a 49 y.o. male without significant past medical history, who presents with shortness breath.  Patient states that he had positive COVID-19 test on 3/4.  He developed shortness of breath in the past 4 days, also has mild dry cough, mild subjective fever and chills.  He also reports chest pain, which is located in the central chest, intermittently, 9 out of 10 in severity, sharp, nonradiating.  Patient has nausea, mild loose stool bowel movement, mild central abdominal tenderness, but no vomiting.  No symptoms of UTI or unilateral weakness.  ED Course: pt was found to have WBC 5.8, troponin 4, electrolytes renal function okay, temperature normal, blood pressure 120/83, oxygen desaturation to 89% on room air, which improved to 95% on 2 L nasal cannula oxygen, heart rate 97 RR 20, chest x-ray showed bilateral patchy infiltration.  CT angiogram was negative for PE.  Patient is admitted to MedSurg bed as inpatient  Currently further plan is to continue supplemental oxygen, remdesivir and IV Solu-Medrol  Assessment and Plan:  Principal Problem:   Acute respiratory disease due to COVID-19 virus Active Problems:   Chest pain   Acute respiratory disease due to COVID-19 virus: Oxygen desaturation to 89% on room air, need 2 L nasal cannula oxygen with saturation 95%.  Chest x-ray showed bilateral patchy effusion.  CT angiogram was negative for PE. -3/16 =>3/20 Remdesivir per pharm -s/p Solumedrol 60 mg bid till 3/18 -- 3/19 started dexamethasone 40 mg p.o. daily for 7 additional days -vitamin C, and zinc.  -Bronchodilators -PRN Mucinex for cough -f/u Blood culture, NGTD -D-dimer 1313.06 , BNP wnl,Trop negative x 1,CRP 14.2,  LDH 272, Procalcitonin, Ferritin 257 wnl, fibinogen >750, TG 150, Hep B Sag NR, HIV ab NR -Daily CRP, Ferritin, D-dimer, LFTs -Will ask the patient to maintain an awake prone position for 16+ hours a day, if possible, with a minimum of 2-3 hours at a time -Will attempt to maintain euvolemia to a net negative fluid status -IF patient deteriorates, will consult PCCM and ID  Chest pain: Trop negative. CAT of chest is negative for PE. Likely due to viral pneumonia -As needed Percocet and morphine for pain   Body mass index is 37.97 kg/m.  Nutrition Problem: Increased nutrient needs Etiology: catabolic illness(COVID-19) Interventions: Interventions: Ensure Enlive (each supplement provides 350kcal and 20 grams of protein), Liberalize Diet, Education    Diet: Regular DVT Prophylaxis: Subcutaneous Lovenox   Advance goals of care discussion: Full code  Family Communication: family was not present at bedside, at the time of interview.  Discussed with patient's wife over the phone 08/22/2019, all questions and concerns answered. The pt provided permission to discuss medical plan with the family. Opportunity was given to ask question and all questions were answered satisfactorily.   Disposition:  Pt is from home, admitted with COVID-19 pneumonia, still has hypoxia, which precludes a safe discharge. Discharge to home, when oxygen will be weaned off vs improvement in the symptoms, still on IV remdesivir.  Subjective: Patient was seen and examined at bedside in the morning, feels improvement in the shortness of breath but is still requires oxygen 4 L via nasal cannula. Patient denies any chest pain or palpitations, no abdominal pain  and no nausea vomiting or diarrhea. Patient was requesting to talk to his wife for updates.  Physical Exam: General:  alert oriented to time, place, and person.  Appear in mild distress, affect appropriate Eyes: PERRLA ENT: Oral Mucosa Clear, moist  Neck: no  JVD,  Cardiovascular: S1 and S2 Present, no Murmur,  Respiratory: good respiratory effort, Bilateral Air entry equal and Decreased, b/l Crackles, mild wheezes Abdomen: Bowel Sound present, Soft and no tenderness,  Skin: No rashes Extremities: No pedal edema, no calf tenderness Neurologic: without any new focal findings Gait not checked due to patient safety concerns  Vitals:   08/22/19 0040 08/22/19 0850 08/22/19 1612 08/23/19 0000  BP: (!) 134/97 122/80  132/80  Pulse: 100   78  Resp: 18 18  18   Temp: 98.7 F (37.1 C)  98.5 F (36.9 C) 98.2 F (36.8 C)  TempSrc:   Oral Oral  SpO2: (!) 89% 92%  95%  Weight:      Height:        Intake/Output Summary (Last 24 hours) at 08/23/2019 1415 Last data filed at 08/23/2019 0900 Gross per 24 hour  Intake 240 ml  Output --  Net 240 ml   Filed Weights   08/21/19 1115  Weight: 127 kg    Data Reviewed: I have personally reviewed and interpreted daily labs, tele strips, imagings as discussed above. I reviewed all nursing notes, pharmacy notes, vitals, pertinent old records I have discussed plan of care as described above with RN and patient/family.  CBC: Recent Labs  Lab 08/21/19 1136 08/22/19 0447 08/23/19 0508  WBC 8.0 4.9 15.1*  NEUTROABS  --  3.6 13.0*  HGB 14.6 13.4 13.5  HCT 45.1 42.9 42.5  MCV 77.2* 80.2 79.7*  PLT 418* 502* 027*   Basic Metabolic Panel: Recent Labs  Lab 08/21/19 1136 08/23/19 0508  NA 136 140  K 3.8 4.7  CL 98 104  CO2 25 29  GLUCOSE 115* 154*  BUN 16 21*  CREATININE 0.92 0.93  CALCIUM 9.0 9.1  MG  --  2.4  PHOS  --  3.6    Studies: No results found.  Scheduled Meds: . vitamin C  500 mg Oral Daily  . dextromethorphan-guaiFENesin  1 tablet Oral BID  . enoxaparin (LOVENOX) injection  0.5 mg/kg Subcutaneous Q24H  . feeding supplement (ENSURE ENLIVE)  237 mL Oral TID BM  . ipratropium  2 puff Inhalation Q4H  . methylPREDNISolone (SOLU-MEDROL) injection  60 mg Intravenous Q12H  . zinc  sulfate  220 mg Oral Daily   Continuous Infusions: . remdesivir 100 mg in NS 100 mL 100 mg (08/23/19 0933)   PRN Meds: acetaminophen, albuterol, morphine injection, ondansetron (ZOFRAN) IV, oxyCODONE-acetaminophen  Time spent: 35 minutes  Author: Val Riles. MD [facility] 08/23/2019 2:15 PM  To reach On-call, see care teams to locate the attending and reach out to them via [email or web address]. If 7PM-7AM, please contact night-coverage If you still have difficulty reaching the attending provider, please page the [facility] (Director on Call) for [facility] on amion for assistance.

## 2019-08-24 LAB — HEPATIC FUNCTION PANEL
ALT: 142 U/L — ABNORMAL HIGH (ref 0–44)
AST: 94 U/L — ABNORMAL HIGH (ref 15–41)
Albumin: 3.1 g/dL — ABNORMAL LOW (ref 3.5–5.0)
Alkaline Phosphatase: 66 U/L (ref 38–126)
Bilirubin, Direct: <0.1 mg/dL (ref 0.0–0.2)
Total Bilirubin: 0.6 mg/dL (ref 0.3–1.2)
Total Protein: 7.7 g/dL (ref 6.5–8.1)

## 2019-08-24 LAB — BASIC METABOLIC PANEL
Anion gap: 8 (ref 5–15)
BUN: 24 mg/dL — ABNORMAL HIGH (ref 6–20)
CO2: 29 mmol/L (ref 22–32)
Calcium: 8.9 mg/dL (ref 8.9–10.3)
Chloride: 104 mmol/L (ref 98–111)
Creatinine, Ser: 0.85 mg/dL (ref 0.61–1.24)
GFR calc Af Amer: >60 mL/min (ref >60)
GFR calc non Af Amer: >60 mL/min (ref >60)
Glucose, Bld: 131 mg/dL — ABNORMAL HIGH (ref 70–99)
Potassium: 4.7 mmol/L (ref 3.5–5.1)
Sodium: 141 mmol/L (ref 135–145)

## 2019-08-24 LAB — CBC WITH DIFFERENTIAL/PLATELET
Abs Immature Granulocytes: 0.11 10*3/uL — ABNORMAL HIGH (ref 0.00–0.07)
Basophils Absolute: 0 10*3/uL (ref 0.0–0.1)
Basophils Relative: 0 %
Eosinophils Absolute: 0 10*3/uL (ref 0.0–0.5)
Eosinophils Relative: 0 %
HCT: 42.2 % (ref 39.0–52.0)
Hemoglobin: 13.8 g/dL (ref 13.0–17.0)
Immature Granulocytes: 1 %
Lymphocytes Relative: 12 %
Lymphs Abs: 2 10*3/uL (ref 0.7–4.0)
MCH: 25.7 pg — ABNORMAL LOW (ref 26.0–34.0)
MCHC: 32.7 g/dL (ref 30.0–36.0)
MCV: 78.7 fL — ABNORMAL LOW (ref 80.0–100.0)
Monocytes Absolute: 1.2 10*3/uL — ABNORMAL HIGH (ref 0.1–1.0)
Monocytes Relative: 7 %
Neutro Abs: 13.5 10*3/uL — ABNORMAL HIGH (ref 1.7–7.7)
Neutrophils Relative %: 80 %
Platelets: 634 10*3/uL — ABNORMAL HIGH (ref 150–400)
RBC: 5.36 MIL/uL (ref 4.22–5.81)
RDW: 14.1 % (ref 11.5–15.5)
WBC: 16.8 10*3/uL — ABNORMAL HIGH (ref 4.0–10.5)
nRBC: 0 % (ref 0.0–0.2)

## 2019-08-24 LAB — MAGNESIUM: Magnesium: 2.6 mg/dL — ABNORMAL HIGH (ref 1.7–2.4)

## 2019-08-24 LAB — PHOSPHORUS: Phosphorus: 3.1 mg/dL (ref 2.5–4.6)

## 2019-08-24 LAB — FERRITIN: Ferritin: 312 ng/mL (ref 24–336)

## 2019-08-24 LAB — FIBRIN DERIVATIVES D-DIMER (ARMC ONLY): Fibrin derivatives D-dimer (ARMC): 713.53 ng/mL (FEU) — ABNORMAL HIGH (ref 0.00–499.00)

## 2019-08-24 LAB — C-REACTIVE PROTEIN: CRP: 3.6 mg/dL — ABNORMAL HIGH (ref <1.0)

## 2019-08-24 NOTE — Progress Notes (Signed)
[facility] Progress Note  Patient: Alan Nguyen    HEN:[id number]  DOA: 08/21/2019     Date of Service: the patient was seen and examined on 08/24/2019  Chief Complaint  Patient presents with  . Chest Pain   Brief hospital course: Alan Nguyen is a 49 y.o. male without significant past medical history, who presents with shortness breath.  Patient states that he had positive COVID-19 test on 3/4.  He developed shortness of breath in the past 4 days, also has mild dry cough, mild subjective fever and chills.  He also reports chest pain, which is located in the central chest, intermittently, 9 out of 10 in severity, sharp, nonradiating.  Patient has nausea, mild loose stool bowel movement, mild central abdominal tenderness, but no vomiting.  No symptoms of UTI or unilateral weakness.  ED Course: pt was found to have WBC 5.8, troponin 4, electrolytes renal function okay, temperature normal, blood pressure 120/83, oxygen desaturation to 89% on room air, which improved to 95% on 2 L nasal cannula oxygen, heart rate 97 RR 20, chest x-ray showed bilateral patchy infiltration.  CT angiogram was negative for PE.  Patient is admitted to [place] bed as inpatient  Currently further plan is to continue supplemental oxygen, remdesivir and IV Solu-Medrol  Assessment and Plan:  Principal Problem:   Acute respiratory disease due to COVID-19 virus Active Problems:   Chest pain   Acute respiratory disease due to COVID-19 virus: Oxygen desaturation to 89% on room air, need 2 L nasal cannula oxygen with saturation 95%.  Chest x-ray showed bilateral patchy effusion.  CT angiogram was negative for PE. -3/16 =>3/20 Remdesivir per pharm -s/p Solumedrol 60 mg bid till 3/18 -- 3/19 started dexamethasone 40 mg p.o. daily for 7 additional days -vitamin C, and zinc.  -Bronchodilators -PRN Mucinex for cough -f/u Blood culture, NGTD -D-dimer 1313.06 , BNP wnl,Trop negative x 1,CRP 14.2,  LDH 272, Procalcitonin, Ferritin 257 wnl, fibinogen >750, TG 150, Hep B Sag NR, HIV ab NR -Daily CRP, Ferritin, D-dimer, LFTs -Will ask the patient to maintain an awake prone position for 16+ hours a day, if possible, with a minimum of 2-3 hours at a time -Will attempt to maintain euvolemia to a net negative fluid status -IF patient deteriorates, will consult PCCM and ID  Chest pain: Trop negative. CAT of chest is negative for PE. Likely due to viral pneumonia -As needed Percocet and morphine for pain   Body mass index is 37.97 kg/m.  Nutrition Problem: Increased nutrient needs Etiology: catabolic TIRWERX([id number]) Interventions: Interventions: Ensure Enlive (each supplement provides 350kcal and 20 grams of protein), Liberalize Diet, Education    Diet: Regular DVT Prophylaxis: Subcutaneous Lovenox   Advance goals of care discussion: Full code  Family Communication: family was not present at bedside, at the time of interview.  Discussed with patient's wife over the phone 08/22/2019, all questions and concerns answered. The pt provided permission to discuss medical plan with the family. Opportunity was given to ask question and all questions were answered satisfactorily.   Disposition:  Pt is from home, admitted with COVID-19 pneumonia, still has hypoxia, which precludes a safe discharge. Discharge to home, when oxygen will be weaned off vs improvement in the symptoms, still on IV remdesivir.  Patient can be discharged home tomorrow after last dose of remdesivir.  Subjective: Patient was seen and examined at bedside in the morning, feels improvement in the shortness of breath but is still requires oxygen 1-2 L via  nasal cannula. Patient denies any chest pain or palpitations, no abdominal pain and no nausea vomiting or diarrhea.  Physical Exam: General:  alert oriented to time, place, and person.  Appear in mild distress, affect appropriate Eyes: PERRLA ENT: Oral Mucosa Clear,  moist  Neck: no JVD,  Cardiovascular: S1 and S2 Present, no Murmur,  Respiratory: good respiratory effort, Bilateral Air entry equal and Decreased, b/l Crackles, mild wheezes Abdomen: Bowel Sound present, Soft and no tenderness,  Skin: No rashes Extremities: No pedal edema, no calf tenderness Neurologic: without any new focal findings Gait not checked due to patient safety concerns  Vitals:   08/23/19 1600 08/24/19 0025 08/24/19 0830 08/24/19 1018  BP:  (!) 148/92 (!) 140/93 133/75  Pulse: 85 94 89 87  Resp: 15 20 (!) 27 20  Temp:  99.6 F (37.6 C) 98.6 F (37 C) 98.3 F (36.8 C)  TempSrc:  Oral Oral Oral  SpO2: 91% 94% 93% 91%  Weight:      Height:       No intake or output data in the 24 hours ending 08/24/19 1557 Filed Weights   08/21/19 1115  Weight: 127 kg    Data Reviewed: I have personally reviewed and interpreted daily labs, tele strips, imagings as discussed above. I reviewed all nursing notes, pharmacy notes, vitals, pertinent old records I have discussed plan of care as described above with RN and patient/family.  CBC: Recent Labs  Lab 08/21/19 1136 08/22/19 0447 08/23/19 0508 08/24/19 0555  WBC 8.0 4.9 15.1* 16.8*  NEUTROABS  --  3.6 13.0* 13.5*  HGB 14.6 13.4 13.5 13.8  HCT 45.1 42.9 42.5 42.2  MCV 77.2* 80.2 79.7* 78.7*  PLT 418* 502* 569* 634*   Basic Metabolic Panel: Recent Labs  Lab 08/21/19 1136 08/23/19 0508 08/24/19 0555  NA 136 140 141  K 3.8 4.7 4.7  CL 98 104 104  CO2 25 29 29   GLUCOSE 115* 154* 131*  BUN 16 21* 24*  CREATININE 0.92 0.93 0.85  CALCIUM 9.0 9.1 8.9  MG  --  2.4 2.6*  PHOS  --  3.6 3.1    Studies: No results found.  Scheduled Meds: . vitamin C  500 mg Oral Daily  . dexamethasone  6 mg Oral Daily  . dextromethorphan-guaiFENesin  1 tablet Oral BID  . enoxaparin (LOVENOX) injection  0.5 mg/kg Subcutaneous Q24H  . feeding supplement (ENSURE ENLIVE)  237 mL Oral TID BM  . ipratropium  2 puff Inhalation Q4H  .  zinc sulfate  220 mg Oral Daily   Continuous Infusions: . remdesivir 100 mg in NS 100 mL 100 mg (08/24/19 1339)   PRN Meds: acetaminophen, albuterol, morphine injection, ondansetron (ZOFRAN) IV, oxyCODONE-acetaminophen  Time spent: 35 minutes  Author: 08/26/19. MD [facility] 08/24/2019 3:57 PM  To reach On-call, see care teams to locate the attending and reach out to them via [email or web address]. If 7PM-7AM, please contact night-coverage If you still have difficulty reaching the attending provider, please page the [facility] (Director on Call) for [facility] on amion for assistance.

## 2019-08-25 LAB — CBC WITH DIFFERENTIAL/PLATELET
Abs Immature Granulocytes: 0.08 10*3/uL — ABNORMAL HIGH (ref 0.00–0.07)
Basophils Absolute: 0 10*3/uL (ref 0.0–0.1)
Basophils Relative: 0 %
Eosinophils Absolute: 0 10*3/uL (ref 0.0–0.5)
Eosinophils Relative: 0 %
HCT: 43.8 % (ref 39.0–52.0)
Hemoglobin: 14 g/dL (ref 13.0–17.0)
Immature Granulocytes: 1 %
Lymphocytes Relative: 26 %
Lymphs Abs: 3.2 10*3/uL (ref 0.7–4.0)
MCH: 25.4 pg — ABNORMAL LOW (ref 26.0–34.0)
MCHC: 32 g/dL (ref 30.0–36.0)
MCV: 79.3 fL — ABNORMAL LOW (ref 80.0–100.0)
Monocytes Absolute: 1.1 10*3/uL — ABNORMAL HIGH (ref 0.1–1.0)
Monocytes Relative: 9 %
Neutro Abs: 7.9 10*3/uL — ABNORMAL HIGH (ref 1.7–7.7)
Neutrophils Relative %: 64 %
Platelets: 644 10*3/uL — ABNORMAL HIGH (ref 150–400)
RBC: 5.52 MIL/uL (ref 4.22–5.81)
RDW: 14.3 % (ref 11.5–15.5)
WBC: 12.2 10*3/uL — ABNORMAL HIGH (ref 4.0–10.5)
nRBC: 0 % (ref 0.0–0.2)

## 2019-08-25 LAB — BASIC METABOLIC PANEL
Anion gap: 9 (ref 5–15)
BUN: 22 mg/dL — ABNORMAL HIGH (ref 6–20)
CO2: 28 mmol/L (ref 22–32)
Calcium: 8.6 mg/dL — ABNORMAL LOW (ref 8.9–10.3)
Chloride: 103 mmol/L (ref 98–111)
Creatinine, Ser: 0.79 mg/dL (ref 0.61–1.24)
GFR calc Af Amer: >60 mL/min (ref >60)
GFR calc non Af Amer: >60 mL/min (ref >60)
Glucose, Bld: 111 mg/dL — ABNORMAL HIGH (ref 70–99)
Potassium: 4.6 mmol/L (ref 3.5–5.1)
Sodium: 140 mmol/L (ref 135–145)

## 2019-08-25 LAB — FERRITIN: Ferritin: 279 ng/mL (ref 24–336)

## 2019-08-25 LAB — MAGNESIUM: Magnesium: 2.3 mg/dL (ref 1.7–2.4)

## 2019-08-25 LAB — C-REACTIVE PROTEIN: CRP: 1.9 mg/dL — ABNORMAL HIGH (ref <1.0)

## 2019-08-25 LAB — HEPATIC FUNCTION PANEL
ALT: 127 U/L — ABNORMAL HIGH (ref 0–44)
AST: 49 U/L — ABNORMAL HIGH (ref 15–41)
Albumin: 3 g/dL — ABNORMAL LOW (ref 3.5–5.0)
Alkaline Phosphatase: 61 U/L (ref 38–126)
Bilirubin, Direct: <0.1 mg/dL (ref 0.0–0.2)
Total Bilirubin: 0.6 mg/dL (ref 0.3–1.2)
Total Protein: 7.2 g/dL (ref 6.5–8.1)

## 2019-08-25 LAB — PHOSPHORUS: Phosphorus: 3.3 mg/dL (ref 2.5–4.6)

## 2019-08-25 LAB — FIBRIN DERIVATIVES D-DIMER (ARMC ONLY): Fibrin derivatives D-dimer (ARMC): 574.92 ng/mL (FEU) — ABNORMAL HIGH (ref 0.00–499.00)

## 2019-08-25 MED ORDER — DM-GUAIFENESIN ER 30-600 MG PO TB12: 1.0000 | ORAL_TABLET | Freq: Two times a day (BID) | ORAL | 0 refills | Status: AC | PRN

## 2019-08-25 MED ORDER — ACETAMINOPHEN 325 MG PO TABS: 650.0000 mg | ORAL_TABLET | Freq: Four times a day (QID) | ORAL | Status: DC | PRN

## 2019-08-25 MED ORDER — DEXAMETHASONE 6 MG PO TABS: 6.0000 mg | ORAL_TABLET | Freq: Every day | ORAL | 0 refills | Status: AC

## 2019-08-25 NOTE — Progress Notes (Signed)
Dishcharge instructions reviewed with patient. AVS given. Aftercare instructions provided, PIV removed. Pt left in stable condition via wheelchair. Picked up by family.

## 2019-08-25 NOTE — Discharge Summary (Signed)
Physician Discharge Summary  Alan Nguyen JSH:[id number] DOB: [birth year] DOA: 08/21/2019  PCP: Patient, No Pcp Per  Admit date: 08/21/2019 Discharge date: 08/25/2019  Admitted From: Home Disposition: Home  Recommendations for Outpatient Follow-up:  1. Follow up with PCP in 1-2 weeks 2.   Home Health: None Equipment/Devices: None Discharge Condition: Stable CODE STATUS: Full Diet recommendation: Heart Healthy  Brief/Interim Summary: Alan Deneal Johnsonis a 48 y.o.malewithout significant past medical history, who presents with shortness breath.  Patient states that he had positive COVID-19 test on 3/4.He developed shortness of breath in the past 4 days, also has mild dry cough, mild subjective fever and chills. He also reports chest pain,which is located in the central chest, intermittently, 9 out of 10 in severity, sharp, nonradiating. Patient has nausea, mild loose stool bowel movement, mild central abdominal tenderness, but no vomiting. No symptoms of UTI or unilateral weakness.  ED Course:pt was found to have WBC 5.8, troponin4,electrolytes renal function okay, temperature normal, blood pressure 120/83, oxygen desaturation to 89% on room air, which improved to 95% on 2 L nasal cannula oxygen, heart rate 97 RR 20, chest x-ray showed bilateral patchy infiltration. CT angiogram was negative for PE. Patient is admitted to MedSurg bed as inpatient  3/20: Completed IV remdesivir in house.  Will prescribe Decadron to complete 7-day course on discharge.  Oxygen gradually tapered down and on room air at time of discharge.  Patient feels well.  No adventitious breath sounds.  Shortness of breath improved.  Patient given clear instructions on isolation at home and clear return to ED instructions.  Patient expressed understanding.  Discharge Diagnoses:  Principal Problem:   Acute respiratory disease due to COVID-19 virus Active Problems:   Chest pain  Acute respiratory  disease due to COVID-19 virus:Oxygen desaturation to 89% on room air, need 2 L nasal cannula oxygen with saturation 95%. Chest x-ray showed bilateral patchy effusion. CT angiogram was negative for PE. -3/16 =>3/20 Remdesivir per pharm -s/p Solumedrol 60 mg bid till 3/18 -- 3/19 started dexamethasone 6 mg p.o. daily for 7 additional days -vitamin C, and zinc.  -Bronchodilators -PRN Mucinex for cough -f/u Blood culture, NGTD -Patient completed full course of remdesivir in house -Weaned to room air at time of discharge -No respiratory distress, stable for discharge home -MyChart instructions for at home COVID-19 monitoring recommended -Self-isolation recommended post discharge -Clear return to ED instructions provided  Chest pain: Trop negative. CAT of chest is negative for PE. Likely due to viral pneumonia -As needed Percocet and morphine for pain Resolved at time of discharge  Discharge Instructions  Discharge Instructions    Diet - low sodium heart healthy   Complete by: As directed    Increase activity slowly   Complete by: As directed    MyChart COVID-19 home monitoring program   Complete by: Aug 25, 2019    Is the patient willing to use the MyChart Mobile App for home monitoring?: Yes   Temperature monitoring   Complete by: Aug 25, 2019    After how many days would you like to receive a notification of this patient's flowsheet entries?: 1     Allergies as of 08/25/2019   No Known Allergies     Medication List    STOP taking these medications   meloxicam 15 MG tablet Commonly known as: MOBIC     TAKE these medications   acetaminophen 325 MG tablet Commonly known as: TYLENOL Take 2 tablets (650 mg total) by mouth every 6 (  six) hours as needed for mild pain or fever.   dexamethasone 6 MG tablet Commonly known as: DECADRON Take 1 tablet (6 mg total) by mouth daily for 5 days. Start taking on: August 26, 2019   dextromethorphan-guaiFENesin 30-600 MG 12hr  tablet Commonly known as: MUCINEX DM Take 1 tablet by mouth 2 (two) times daily as needed for up to 5 days for cough.       No Known Allergies  Consultations:  none   Procedures/Studies: DG Chest 2 View  Result Date: 08/21/2019 CLINICAL DATA:  Shortness of breath, COVID-19 positive EXAM: CHEST - 2 VIEW COMPARISON:  08/06/2008 FINDINGS: The heart size and mediastinal contours are within normal limits. Lung volumes are low. Patchy bilateral airspace opacities in a predominantly peripheral distribution. Prominent interstitial markings throughout both lungs. No pleural effusion or pneumothorax. IMPRESSION: Patchy bilateral airspace opacities in a predominantly peripheral distribution. Findings suggestive of multifocal viral pneumonia in the setting of known COVID-19 infection. Electronically Signed   By: Davina Poke D.O.   On: 08/21/2019 12:14   CT ANGIO CHEST PE W OR WO CONTRAST  Result Date: 08/21/2019 CLINICAL DATA:  Shortness of breath.  COVID-19 positive EXAM: CT ANGIOGRAPHY CHEST WITH CONTRAST TECHNIQUE: Multidetector CT imaging of the chest was performed using the standard protocol during bolus administration of intravenous contrast. Multiplanar CT image reconstructions and MIPs were obtained to evaluate the vascular anatomy. CONTRAST:  44mL OMNIPAQUE IOHEXOL 350 MG/ML SOLN COMPARISON:  Chest radiograph August 21, 2019 FINDINGS: Cardiovascular: There is no demonstrable pulmonary embolus. There is no thoracic aortic aneurysm or dissection. The visualized great vessels appear unremarkable. There is no pericardial effusion or pericardial thickening. Mediastinum/Nodes: Visualized thyroid appears unremarkable. There are scattered subcentimeter mediastinal lymph nodes. There is a lymph node in the right hilum measuring 1.3 x 1.1 cm, mildly enlarged. There is a left subcarinal lymph node measuring 1.3 x 1.1 cm. No other lymph node prominence evident. There is a small hiatal hernia. Lungs/Pleura:  There is airspace opacity throughout the lungs involving virtually all lobes in segments to varying degrees. Most of this opacity is ill-defined, although there are areas of scattered consolidation in both lower lobes and inferior lingular regions. No pleural effusions are appreciable. Upper Abdomen: There is incomplete visualization of a cyst in the upper pole of the left kidney measuring 1.4 x 1.3 cm. Visualized upper abdominal structures otherwise appear unremarkable. Musculoskeletal: There are no blastic or lytic bone lesions. No evident chest wall lesion. Review of the MIP images confirms the above findings. IMPRESSION: 1. No demonstrable pulmonary embolus. No thoracic aortic aneurysm or dissection. 2. Widespread multifocal pneumonia involving most lobes and segments to varying degrees. 3. Mildly prominent right hilar and left subcarinal lymph nodes, likely of reactive etiology given the parenchymal lung changes. Multiple subcentimeter lymph nodes are seen scattered throughout the mediastinum as well. 4.  Small hiatal hernia. Electronically Signed   By: Lowella Grip III M.D.   On: 08/21/2019 15:13    (Echo, Carotid, EGD, Colonoscopy, ERCP)    Subjective: Seen and examined on the day of discharge No complaints, feels well On room air without desaturation Medically stable for discharge home  Discharge Exam: Vitals:   08/25/19 0719 08/25/19 0725  BP:  (!) 130/99  Pulse: 82 73  Resp: 20 12  Temp: 98.3 F (36.8 C) 98.3 F (36.8 C)  SpO2: 90% 95%   Vitals:   08/25/19 0024 08/25/19 0024 08/25/19 0719 08/25/19 0725  BP: 137/87 137/87  (!) 130/99  Pulse: 87 86 82 73  Resp: 18 18 20 12   Temp: 98.6 F (37 C) 98.6 F (37 C) 98.3 F (36.8 C) 98.3 F (36.8 C)  TempSrc:   Oral Oral  SpO2: 93% 93% 90% 95%  Weight:      Height:        General: Pt is alert, awake, not in acute distress Cardiovascular: RRR, S1/S2 +, no rubs, no gallops Respiratory: CTA bilaterally, no wheezing, no  rhonchi Abdominal: Soft, NT, ND, bowel sounds + Extremities: no edema, no cyanosis    The results of significant diagnostics from this hospitalization (including imaging, microbiology, ancillary and laboratory) are listed below for reference.     Microbiology: Recent Results (from the past 240 hour(s))  Blood Culture (routine x 2)     Status: None (Preliminary result)   Collection Time: 08/21/19  1:52 PM   Specimen: BLOOD  Result Value Ref Range Status   Specimen Description BLOOD LEFT ANTECUBITAL  Final   Special Requests   Final    BOTTLES DRAWN AEROBIC AND ANAEROBIC Blood Culture adequate volume   Culture   Final    NO GROWTH 4 DAYS Performed at [facility], [address] [place], [place]    Report Status PENDING  Incomplete  Blood Culture (routine x 2)     Status: None (Preliminary result)   Collection Time: 08/21/19  1:52 PM   Specimen: BLOOD  Result Value Ref Range Status   Specimen Description BLOOD LEFT ANTECUBITAL  Final   Special Requests   Final    BOTTLES DRAWN AEROBIC AND ANAEROBIC Blood Culture adequate volume   Culture   Final    NO GROWTH 4 DAYS Performed at [facility], [address] [place], [place]    Report Status PENDING  Incomplete     Labs: BNP (last 3 results) Recent Labs    08/21/19 1135  BNP 32.0   Basic Metabolic Panel: Recent Labs  Lab 08/21/19 1136 08/23/19 0508 08/24/19 0555 08/25/19 0640  NA 136 140 141 140  K 3.8 4.7 4.7 4.6  CL 98 104 104 103  CO2 25 29 29 28   GLUCOSE 115* 154* 131* 111*  BUN 16 21* 24* 22*  CREATININE 0.92 0.93 0.85 0.79  CALCIUM 9.0 9.1 8.9 8.6*  MG  --  2.4 2.6* 2.3  PHOS  --  3.6 3.1 3.3   Liver Function Tests: Recent Labs  Lab 08/23/19 0508 08/24/19 0555 08/25/19 0640  AST 34 94* 49*  ALT 52* 142* 127*  ALKPHOS 62 66 61  BILITOT 0.7 0.6 0.6  PROT 7.9 7.7 7.2  ALBUMIN 3.2* 3.1* 3.0*   No results for input(s): LIPASE, AMYLASE in the last 168  hours. No results for input(s): AMMONIA in the last 168 hours. CBC: Recent Labs  Lab 08/21/19 1136 08/22/19 0447 08/23/19 0508 08/24/19 0555 08/25/19 0640  WBC 8.0 4.9 15.1* 16.8* 12.2*  NEUTROABS  --  3.6 13.0* 13.5* 7.9*  HGB 14.6 13.4 13.5 13.8 14.0  HCT 45.1 42.9 42.5 42.2 43.8  MCV 77.2* 80.2 79.7* 78.7* 79.3*  PLT 418* 502* 569* 634* 644*   Cardiac Enzymes: No results for input(s): CKTOTAL, CKMB, CKMBINDEX, TROPONINI in the last 168 hours. BNP: Invalid input(s): POCBNP CBG: No results for input(s): GLUCAP in the last 168 hours. D-Dimer No results for input(s): DDIMER in the last 72 hours. Hgb A1c No results for input(s): HGBA1C in the last 72 hours. Lipid Profile No results  for input(s): CHOL, HDL, LDLCALC, TRIG, CHOLHDL, LDLDIRECT in the last 72 hours. Thyroid function studies No results for input(s): TSH, T4TOTAL, T3FREE, THYROIDAB in the last 72 hours.  Invalid input(s): FREET3 Anemia work up Recent Labs    08/24/19 0555 08/25/19 0640  FERRITIN 312 279   Urinalysis    Component Value Date/Time   COLORURINE YELLOW (A) 12/30/2015 1646   APPEARANCEUR CLEAR (A) 12/30/2015 1646   LABSPEC 1.018 12/30/2015 1646   PHURINE 5.0 12/30/2015 1646   GLUCOSEU NEGATIVE 12/30/2015 1646   HGBUR NEGATIVE 12/30/2015 1646   BILIRUBINUR NEGATIVE 12/30/2015 1646   KETONESUR NEGATIVE 12/30/2015 1646   PROTEINUR NEGATIVE 12/30/2015 1646   UROBILINOGEN 0.2 08/15/2007 0831   NITRITE NEGATIVE 12/30/2015 1646   LEUKOCYTESUR NEGATIVE 12/30/2015 1646   Sepsis Labs Invalid input(s): PROCALCITONIN,  WBC,  LACTICIDVEN Microbiology Recent Results (from the past 240 hour(s))  Blood Culture (routine x 2)     Status: None (Preliminary result)   Collection Time: 08/21/19  1:52 PM   Specimen: BLOOD  Result Value Ref Range Status   Specimen Description BLOOD LEFT ANTECUBITAL  Final   Special Requests   Final    BOTTLES DRAWN AEROBIC AND ANAEROBIC Blood Culture adequate volume    Culture   Final    NO GROWTH 4 DAYS Performed at [facility], [address], [place], [place] [address]    Report Status PENDING  Incomplete  Blood Culture (routine x 2)     Status: None (Preliminary result)   Collection Time: 08/21/19  1:52 PM   Specimen: BLOOD  Result Value Ref Range Status   Specimen Description BLOOD LEFT ANTECUBITAL  Final   Special Requests   Final    BOTTLES DRAWN AEROBIC AND ANAEROBIC Blood Culture adequate volume   Culture   Final    NO GROWTH 4 DAYS Performed at [facility], [address] [place], [place] [address]    Report Status PENDING  Incomplete     Time coordinating discharge: Over 30 minutes  SIGNED:   Tresa Moore, MD  [facility] 08/25/2019, 2:35 PM Pager   If 7PM-7AM, please contact night-coverage

## 2019-08-26 LAB — CULTURE, BLOOD (ROUTINE X 2)
Culture: NO GROWTH
Culture: NO GROWTH
Special Requests: ADEQUATE
Special Requests: ADEQUATE

## 2019-09-03 ENCOUNTER — Ambulatory Visit: Payer: Self-pay

## 2019-09-03 NOTE — Telephone Encounter (Signed)
Patient called stating that his quarantine had ended for COVID-19. He denies symptoms, no fever in 3 consecutive days.  He wanted to know if there was any other restrictions. Patient was instructed to continue to practice the 3 w's when out in public areas. Other wise we do not recommend retesting. We reviewed information on scheduling at our testing sites incase his employer requires it for return to work. Reason for Disposition . General information question, no triage required and triager able to answer question  Protocols used: INFORMATION ONLY CALL - NO TRIAGE-A-AH

## 2020-07-16 ENCOUNTER — Other Ambulatory Visit: Payer: Self-pay | Admitting: Chiropractor

## 2020-07-16 ENCOUNTER — Ambulatory Visit
Admission: RE | Admit: 2020-07-16 | Discharge: 2020-07-16 | Disposition: A | Discharge status: Home / Self Care | Payer: BC Managed Care – PPO | Attending: Hematology and Oncology | Admitting: Hematology and Oncology

## 2020-07-16 ENCOUNTER — Other Ambulatory Visit: Payer: Self-pay

## 2020-07-16 ENCOUNTER — Ambulatory Visit
Admission: RE | Admit: 2020-07-16 | Discharge: 2020-07-16 | Disposition: A | Discharge status: Home / Self Care | Payer: BC Managed Care – PPO | Source: Ambulatory Visit | Attending: Chiropractor | Admitting: Chiropractor

## 2020-07-16 DIAGNOSIS — S134XXA Sprain of ligaments of cervical spine, initial encounter: Secondary | ICD-10-CM

## 2020-07-16 DIAGNOSIS — S233XXA Sprain of ligaments of thoracic spine, initial encounter: Secondary | ICD-10-CM | POA: Insufficient documentation

## 2022-04-12 ENCOUNTER — Ambulatory Visit
Admission: EM | Admit: 2022-04-12 | Discharge: 2022-04-12 | Disposition: A | Discharge status: Home / Self Care | Payer: BC Managed Care – PPO | Attending: Internal Medicine | Admitting: Internal Medicine

## 2022-04-12 ENCOUNTER — Encounter: Payer: Self-pay | Admitting: Emergency Medicine

## 2022-04-12 DIAGNOSIS — M10031 Idiopathic gout, right wrist: Secondary | ICD-10-CM | POA: Diagnosis present

## 2022-04-12 DIAGNOSIS — M25431 Effusion, right wrist: Secondary | ICD-10-CM | POA: Diagnosis present

## 2022-04-12 DIAGNOSIS — M25531 Pain in right wrist: Secondary | ICD-10-CM | POA: Insufficient documentation

## 2022-04-12 DIAGNOSIS — I10 Essential (primary) hypertension: Secondary | ICD-10-CM | POA: Diagnosis present

## 2022-04-12 HISTORY — DX: Essential (primary) hypertension: I10

## 2022-04-12 LAB — CBC WITH DIFFERENTIAL/PLATELET
Abs Immature Granulocytes: 0.02 10*3/uL (ref 0.00–0.07)
Basophils Absolute: 0 10*3/uL (ref 0.0–0.1)
Basophils Relative: 0 %
Eosinophils Absolute: 0.4 10*3/uL (ref 0.0–0.5)
Eosinophils Relative: 5 %
HCT: 45 % (ref 39.0–52.0)
Hemoglobin: 14.5 g/dL (ref 13.0–17.0)
Immature Granulocytes: 0 %
Lymphocytes Relative: 36 %
Lymphs Abs: 3.1 10*3/uL (ref 0.7–4.0)
MCH: 26.1 pg (ref 26.0–34.0)
MCHC: 32.2 g/dL (ref 30.0–36.0)
MCV: 80.9 fL (ref 80.0–100.0)
Monocytes Absolute: 0.6 10*3/uL (ref 0.1–1.0)
Monocytes Relative: 7 %
Neutro Abs: 4.6 10*3/uL (ref 1.7–7.7)
Neutrophils Relative %: 52 %
Platelets: 381 10*3/uL (ref 150–400)
RBC: 5.56 MIL/uL (ref 4.22–5.81)
RDW: 15.4 % (ref 11.5–15.5)
WBC: 8.7 10*3/uL (ref 4.0–10.5)
nRBC: 0 % (ref 0.0–0.2)

## 2022-04-12 LAB — COMPREHENSIVE METABOLIC PANEL
ALT: 35 U/L (ref 0–44)
AST: 27 U/L (ref 15–41)
Albumin: 4.2 g/dL (ref 3.5–5.0)
Alkaline Phosphatase: 85 U/L (ref 38–126)
Anion gap: 6 (ref 5–15)
BUN: 15 mg/dL (ref 6–20)
CO2: 27 mmol/L (ref 22–32)
Calcium: 9 mg/dL (ref 8.9–10.3)
Chloride: 104 mmol/L (ref 98–111)
Creatinine, Ser: 0.92 mg/dL (ref 0.61–1.24)
GFR, Estimated: >60 mL/min (ref >60)
Glucose, Bld: 114 mg/dL — ABNORMAL HIGH (ref 70–99)
Potassium: 4.3 mmol/L (ref 3.5–5.1)
Sodium: 137 mmol/L (ref 135–145)
Total Bilirubin: 0.6 mg/dL (ref 0.3–1.2)
Total Protein: 8.7 g/dL — ABNORMAL HIGH (ref 6.5–8.1)

## 2022-04-12 LAB — URIC ACID: Uric Acid, Serum: 9.6 mg/dL — ABNORMAL HIGH (ref 3.7–8.6)

## 2022-04-12 MED ORDER — TRAMADOL HCL 50 MG PO TABS: 50.0000 mg | ORAL_TABLET | Freq: Four times a day (QID) | ORAL | 0 refills | Status: DC | PRN

## 2022-04-12 MED ORDER — COLCHICINE 0.6 MG PO TABS: ORAL_TABLET | ORAL | 0 refills | Status: DC

## 2022-04-12 MED ORDER — ACETAMINOPHEN 500 MG PO TABS
1000.0000 mg | ORAL_TABLET | Freq: Once | ORAL | Status: AC
  Administered 2022-04-12: 1000 mg via ORAL

## 2022-04-12 NOTE — ED Provider Notes (Signed)
[facility]    CSN: [id number] Arrival date & time: 04/12/22  0807      History   Chief Complaint Chief Complaint  Patient presents with   Hand Pain    HPI Alan Nguyen is a 51 y.o. male presents with L wrist and hand pain and swelling since his am. Was fine when he went to bed this am Pt denies injuring himself or doing repetitive work with his hands. Yesterday he rested all day, and had chinese food last night for dinner. He admits he did not take his BP med this am. He does not monitor his BP.     Past Medical History:  Diagnosis Date   Hypertension    Obesity     Patient Active Problem List   Diagnosis Date Noted   Acute respiratory disease due to COVID-19 virus 08/21/2019   Chest pain 08/21/2019    Past Surgical History:  Procedure Laterality Date   TOOTH EXTRACTION         Home Medications    Prior to Admission medications   Medication Sig Start Date End Date Taking? Authorizing Provider  amLODipine-benazepril (LOTREL) 10-20 MG capsule Take 1 capsule by mouth every morning. 01/20/22  Yes [provider]  colchicine 0.6 MG tablet Take 2 right away, then one hour later take one 04/12/22  Yes Rodriguez-Southworth, Sunday Spillers, PA-C  traMADol (ULTRAM) 50 MG tablet Take 1 tablet (50 mg total) by mouth every 6 (six) hours as needed. 04/12/22  Yes Rodriguez-Southworth, Sunday Spillers, PA-C  acetaminophen (TYLENOL) 325 MG tablet Take 2 tablets (650 mg total) by mouth every 6 (six) hours as needed for mild pain or fever. 08/25/19   Sidney Ace, MD    Family History Family History  Problem Relation Age of Onset   Cancer Father     Social History Social History   Tobacco Use   Smoking status: Never   Smokeless tobacco: Never  Vaping Use   Vaping Use: Never used  Substance Use Topics   Alcohol use: Yes    Comment: socially   Drug use: No     Allergies   Patient has no known allergies.   Review of Systems Review of Systems   HENT:  Negative for tinnitus.   Respiratory:  Positive for apnea. Negative for chest tightness and shortness of breath.        + snores and his wife has noticed him having pauses in his breathing when he sleeps. Has never had a sleep test   Cardiovascular:  Negative for chest pain and leg swelling.  Musculoskeletal:  Positive for arthralgias and joint swelling. Negative for gait problem.  Skin:  Negative for rash and wound.  Neurological:  Positive for headaches.       Gets HA's every other day     Physical Exam Triage Vital Signs ED Triage Vitals  Enc Vitals Group     BP 04/12/22 0826 (!) 167/123     Pulse Rate 04/12/22 0826 91     Resp 04/12/22 0826 18     Temp 04/12/22 0826 98.6 F (37 C)     Temp Source 04/12/22 0826 Oral     SpO2 04/12/22 0826 95 %     Weight --      Height --      Head Circumference --      Peak Flow --      Pain Score 04/12/22 0824 8     Pain Loc --  Pain Edu? --      Excl. in [place]? --    No data found.  Updated Vital Signs BP (!) 167/123 (BP Location: Right Arm) Comment: Pt reports not taking BP meds today  Pulse 91   Temp 98.6 F (37 C) (Oral)   Resp 18   SpO2 95%   Visual Acuity Right Eye Distance:   Left Eye Distance:   Bilateral Distance:    Right Eye Near:   Left Eye Near:    Bilateral Near:     Physical Exam Vitals and nursing note reviewed.  Constitutional:      General: He is not in acute distress.    Appearance: He is obese. He is not toxic-appearing.  HENT:     Head: Normocephalic.     Right Ear: External ear normal.     Left Ear: External ear normal.  Eyes:     General: No scleral icterus.    Conjunctiva/sclera: Conjunctivae normal.  Cardiovascular:     Rate and Rhythm: Normal rate and regular rhythm.  Pulmonary:     Effort: Pulmonary effort is normal.     Breath sounds: Normal breath sounds.  Musculoskeletal:     Cervical back: Neck supple.     Comments: L WRIST/HAND- is guarded to move his hand, and worse  with supination of hand which provoked more pain on his volar wrist. Has a lot of pain with soft tissue palpation of his wrist and hand. Feels a little warmer than the R hand. ROM is limited due to pain.   Skin:    General: Skin is warm and dry.     Findings: No bruising, erythema or rash.  Neurological:     Mental Status: He is alert and oriented to person, place, and time.     Gait: Gait normal.  Psychiatric:        Mood and Affect: Mood normal.        Behavior: Behavior normal.        Thought Content: Thought content normal.        Judgment: Judgment normal.      UC Treatments / Results  Labs (all labs ordered are listed, but only abnormal results are displayed) Labs Reviewed  COMPREHENSIVE METABOLIC PANEL - Abnormal; Notable for the following components:      Result Value   Glucose, Bld 114 (*)    Total Protein 8.7 (*)    All other components within normal limits  URIC ACID - Abnormal; Notable for the following components:   Uric Acid, Serum 9.6 (*)    All other components within normal limits  CBC WITH DIFFERENTIAL/PLATELET    EKG   Radiology No results found.  Procedures Procedures (including critical care time)  Medications Ordered in UC Medications  acetaminophen (TYLENOL) tablet 1,000 mg (1,000 mg Oral Given 04/12/22 0925)    Initial Impression / Assessment and Plan / UC Course  I have reviewed the triage vital signs and the nursing notes.  Pertinent labs  results that were available during my care of the patient were reviewed by me and considered in my medical decision making (see chart for details).  CMP, CBC and Uric acid were ordered He was given Tylenol 1000 mg PO here  Clinical Course as of 04/12/22 1302  Mon Apr 12, 2022  0853 BP(!): 167/123 [SR]    Clinical Course User Index [SR] Rodriguez-Southworth, Sunday Spillers, PA-C   Acute gout Uncontrolled HTN  I placed him on Colcrys and Tramadol as  noted. See instructions.   Final Clinical  Impressions(s) / UC Diagnoses  Acute gout Uncontrolled HTN  Final diagnoses:  Right wrist pain  Swollen wrist, right  Uncontrolled hypertension  Acute idiopathic gout of right wrist     Discharge Instructions      Take your blood pressure 2 hours after you take your medication today and monitor it, it should be less than 140/90 Talk with your PCP about getting a sleep study done      ED Prescriptions     Medication Sig Dispense Auth. Provider   colchicine 0.6 MG tablet Take 2 right away, then one hour later take one 3 tablet Rodriguez-Southworth, Sunday Spillers, PA-C   traMADol (ULTRAM) 50 MG tablet Take 1 tablet (50 mg total) by mouth every 6 (six) hours as needed. 10 tablet Rodriguez-Southworth, Sunday Spillers, PA-C      I have reviewed the PDMP during this encounter.   Shelby Mattocks, PA-C 04/12/22 1304

## 2022-04-12 NOTE — ED Triage Notes (Signed)
Pt presents with left hand pain and swelling since this morning. Pt denies any injury.

## 2022-04-12 NOTE — Discharge Instructions (Signed)
Take your blood pressure 2 hours after you take your medication today and monitor it, it should be less than 140/90 Talk with your PCP about getting a sleep study done

## 2022-08-31 ENCOUNTER — Encounter: Payer: Self-pay | Admitting: Internal Medicine

## 2022-08-31 ENCOUNTER — Ambulatory Visit (INDEPENDENT_AMBULATORY_CARE_PROVIDER_SITE_OTHER): Payer: BC Managed Care – PPO | Admitting: Internal Medicine

## 2022-08-31 VITALS — BP 140/82 | HR 84 | Temp 96.5°F | Ht 72.0 in | Wt 288.4 lb

## 2022-08-31 DIAGNOSIS — E6609 Other obesity due to excess calories: Secondary | ICD-10-CM | POA: Insufficient documentation

## 2022-08-31 DIAGNOSIS — I10 Essential (primary) hypertension: Secondary | ICD-10-CM | POA: Diagnosis not present

## 2022-08-31 DIAGNOSIS — Z6839 Body mass index (BMI) 39.0-39.9, adult: Secondary | ICD-10-CM | POA: Diagnosis not present

## 2022-08-31 MED ORDER — VALSARTAN-HYDROCHLOROTHIAZIDE 80-12.5 MG PO TABS: 1.0000 | ORAL_TABLET | Freq: Every day | ORAL | 11 refills | Status: DC

## 2022-08-31 NOTE — Progress Notes (Signed)
Established Patient Office Visit  Subjective:  Patient ID: Alan Nguyen, male    DOB: 16-Jan-1971  Age: 52 y.o. MRN: [id number]  Chief Complaint  Patient presents with   Follow-up    Follow up med refills    Here for blood pressure concerns. Lost to follow up and hasn't taken his antihypertensives recently. Improved with lifestyle changes as BP has improved from 3 months ago.    No other concerns at this time.   Past Medical History:  Diagnosis Date   Hypertension    Obesity     Past Surgical History:  Procedure Laterality Date   TOOTH EXTRACTION      Social History   Socioeconomic History   Marital status: Married    Spouse name: Not on file   Number of children: Not on file   Years of education: Not on file   Highest education level: Not on file  Occupational History   Not on file  Tobacco Use   Smoking status: Never   Smokeless tobacco: Never  Vaping Use   Vaping Use: Never used  Substance and Sexual Activity   Alcohol use: Yes    Comment: socially   Drug use: No   Sexual activity: Not on file  Other Topics Concern   Not on file  Social History Narrative   Not on file   Social Determinants of Health   Financial Resource Strain: Not on file  Food Insecurity: Not on file  Transportation Needs: Not on file  Physical Activity: Not on file  Stress: Not on file  Social Connections: Not on file  Intimate Partner Violence: Not on file    Family History  Problem Relation Age of Onset   Cancer Father     No Known Allergies  Review of Systems  Constitutional: Negative.   HENT: Negative.    Eyes: Negative.   Respiratory: Negative.    Cardiovascular: Negative.   Gastrointestinal: Negative.   Genitourinary: Negative.   Skin: Negative.   Neurological: Negative.   Endo/Heme/Allergies: Negative.        Objective:   BP (!) 140/82   Pulse 84   Temp (!) 96.5 F (35.8 C) (Tympanic)   Ht 6' (1.829 m)   Wt 288 lb 6.4 oz (130.8 kg)    SpO2 96%   BMI 39.11 kg/m   Vitals:   08/31/22 1315  BP: (!) 140/82  Pulse: 84  Temp: (!) 96.5 F (35.8 C)  Height: 6' (1.829 m)  Weight: 288 lb 6.4 oz (130.8 kg)  SpO2: 96%  TempSrc: Tympanic  BMI (Calculated): 39.11    Physical Exam Vitals reviewed.  Constitutional:      Appearance: Normal appearance. He is obese.  HENT:     Head: Normocephalic.     Left Ear: There is no impacted cerumen.     Nose: Nose normal.     Mouth/Throat:     Mouth: Mucous membranes are moist.     Pharynx: No posterior oropharyngeal erythema.  Eyes:     Extraocular Movements: Extraocular movements intact.     Pupils: Pupils are equal, round, and reactive to light.  Cardiovascular:     Rate and Rhythm: Regular rhythm.     Chest Wall: PMI is not displaced.     Pulses: Normal pulses.     Heart sounds: Normal heart sounds. No murmur heard. Pulmonary:     Effort: Pulmonary effort is normal.     Breath sounds: Normal air entry. No rhonchi  or rales.  Abdominal:     General: Abdomen is flat. Bowel sounds are normal. There is no distension.     Palpations: Abdomen is soft. There is no hepatomegaly, splenomegaly or mass.     Tenderness: There is no abdominal tenderness.  Musculoskeletal:        General: Normal range of motion.     Cervical back: Normal range of motion and neck supple.     Right lower leg: No edema.     Left lower leg: No edema.  Skin:    General: Skin is warm and dry.  Neurological:     General: No focal deficit present.     Mental Status: He is alert and oriented to person, place, and time.     Cranial Nerves: No cranial nerve deficit.     Motor: No weakness.  Psychiatric:        Mood and Affect: Mood normal.        Behavior: Behavior normal.      No results found for any visits on 08/31/22.  No results found for this or any previous visit (from the past 2160 hour([name])).    Assessment & Plan:   Problem List Items Addressed This Visit       Cardiovascular and  Mediastinum   Primary hypertension - Primary   Relevant Medications   valsartan-hydrochlorothiazide (DIOVAN HCT) 80-12.5 MG tablet   Other Relevant Orders   Comprehensive metabolic panel   Lipid panel   CBC With Diff/Platelet   PSA     Other   Class 2 obesity due to excess calories without serious comorbidity with body mass index (BMI) of 39.0 to 39.9 in adult    Return in about 6 weeks (around 10/12/2022) for cpe with labs prior.   Total time spent: 30 minutes  Volanda Napoleon, MD  08/31/2022

## 2022-10-19 ENCOUNTER — Encounter: Payer: BC Managed Care – PPO | Admitting: Internal Medicine
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
## 2022-11-05 ENCOUNTER — Other Ambulatory Visit: Payer: BC Managed Care – PPO

## 2022-11-06 LAB — CBC WITH DIFF/PLATELET
Basophils Absolute: 0 10*3/uL (ref 0.0–0.2)
Basos: 1 %
EOS (ABSOLUTE): 0.2 10*3/uL (ref 0.0–0.4)
Eos: 4 %
Hematocrit: 43.2 % (ref 37.5–51.0)
Hemoglobin: 14.1 g/dL (ref 13.0–17.7)
Immature Grans (Abs): 0 10*3/uL (ref 0.0–0.1)
Immature Granulocytes: 0 %
Lymphocytes Absolute: 2.2 10*3/uL (ref 0.7–3.1)
Lymphs: 44 %
MCH: 26.2 pg — ABNORMAL LOW (ref 26.6–33.0)
MCHC: 32.6 g/dL (ref 31.5–35.7)
MCV: 80 fL (ref 79–97)
Monocytes Absolute: 0.4 10*3/uL (ref 0.1–0.9)
Monocytes: 8 %
Neutrophils Absolute: 2.2 10*3/uL (ref 1.4–7.0)
Neutrophils: 43 %
Platelets: 359 10*3/uL (ref 150–450)
RBC: 5.39 x10E6/uL (ref 4.14–5.80)
RDW: 14.1 % (ref 11.6–15.4)
WBC: 5.1 10*3/uL (ref 3.4–10.8)

## 2022-11-06 LAB — COMPREHENSIVE METABOLIC PANEL
ALT: 22 IU/L (ref 0–44)
AST: 25 IU/L (ref 0–40)
Albumin/Globulin Ratio: 1.4 (ref 1.2–2.2)
Albumin: 4.2 g/dL (ref 3.8–4.9)
Alkaline Phosphatase: 92 IU/L (ref 44–121)
BUN/Creatinine Ratio: 12 (ref 9–20)
BUN: 10 mg/dL (ref 6–24)
Bilirubin Total: 0.3 mg/dL (ref 0.0–1.2)
CO2: 25 mmol/L (ref 20–29)
Calcium: 9.3 mg/dL (ref 8.7–10.2)
Chloride: 103 mmol/L (ref 96–106)
Creatinine, Ser: 0.81 mg/dL (ref 0.76–1.27)
Globulin, Total: 3 g/dL (ref 1.5–4.5)
Glucose: 100 mg/dL — ABNORMAL HIGH (ref 70–99)
Potassium: 4.1 mmol/L (ref 3.5–5.2)
Sodium: 143 mmol/L (ref 134–144)
Total Protein: 7.2 g/dL (ref 6.0–8.5)
eGFR: 105 mL/min/{1.73_m2} (ref >59)

## 2022-11-06 LAB — LIPID PANEL
Chol/HDL Ratio: 5.2 ratio — ABNORMAL HIGH (ref 0.0–5.0)
Cholesterol, Total: 187 mg/dL (ref 100–199)
HDL: 36 mg/dL — ABNORMAL LOW (ref >39)
LDL Chol Calc (NIH): 116 mg/dL — ABNORMAL HIGH (ref 0–99)
Triglycerides: 197 mg/dL — ABNORMAL HIGH (ref 0–149)
VLDL Cholesterol Cal: 35 mg/dL (ref 5–40)

## 2022-11-06 LAB — PSA: Prostate Specific Ag, Serum: 0.9 ng/mL (ref 0.0–4.0)

## 2022-11-12 ENCOUNTER — Encounter: Payer: BC Managed Care – PPO | Admitting: Internal Medicine

## 2022-11-29 ENCOUNTER — Encounter: Payer: Self-pay | Admitting: Internal Medicine

## 2022-11-29 ENCOUNTER — Ambulatory Visit (INDEPENDENT_AMBULATORY_CARE_PROVIDER_SITE_OTHER): Payer: BC Managed Care – PPO | Admitting: Internal Medicine

## 2022-11-29 VITALS — BP 128/90 | HR 78 | Ht 72.0 in | Wt 284.4 lb

## 2022-11-29 DIAGNOSIS — Z1331 Encounter for screening for depression: Secondary | ICD-10-CM

## 2022-11-29 DIAGNOSIS — R7303 Prediabetes: Secondary | ICD-10-CM

## 2022-11-29 DIAGNOSIS — E782 Mixed hyperlipidemia: Secondary | ICD-10-CM | POA: Diagnosis not present

## 2022-11-29 DIAGNOSIS — E6609 Other obesity due to excess calories: Secondary | ICD-10-CM | POA: Diagnosis not present

## 2022-11-29 DIAGNOSIS — I10 Essential (primary) hypertension: Secondary | ICD-10-CM | POA: Diagnosis not present

## 2022-11-29 DIAGNOSIS — Z6839 Body mass index (BMI) 39.0-39.9, adult: Secondary | ICD-10-CM

## 2022-11-29 NOTE — Progress Notes (Signed)
Established Patient Office Visit  Subjective:  Patient ID: Alan Nguyen, male    DOB: May 29, 1971  Age: 52 y.o. MRN: [id number]  Chief Complaint  Patient presents with   Annual Exam    Physical 6 week f/u   Follow-up    No new complaints, here for CPE, lab review and medication refills. Lost weight with diet and exercise.Labs reviewed and notable for elevated LDL and triglycerides on lab review. CMP notable for improvement in fasting glucose although still in the prediabetic range. BP elevated today but states his home BP readings have been within the normal range.       No other concerns at this time.   Past Medical History:  Diagnosis Date   Hypertension    Obesity     Past Surgical History:  Procedure Laterality Date   TOOTH EXTRACTION      Social History   Socioeconomic History   Marital status: Married    Spouse name: Not on file   Number of children: Not on file   Years of education: Not on file   Highest education level: Not on file  Occupational History   Not on file  Tobacco Use   Smoking status: Never   Smokeless tobacco: Never  Vaping Use   Vaping Use: Never used  Substance and Sexual Activity   Alcohol use: Yes    Comment: socially   Drug use: No   Sexual activity: Not on file  Other Topics Concern   Not on file  Social History Narrative   Not on file   Social Determinants of Health   Financial Resource Strain: Not on file  Food Insecurity: Not on file  Transportation Needs: Not on file  Physical Activity: Not on file  Stress: Not on file  Social Connections: Not on file  Intimate Partner Violence: Not on file    Family History  Problem Relation Age of Onset   Cancer Father     No Known Allergies  Review of Systems  Constitutional: Negative.   HENT: Negative.    Eyes: Negative.   Respiratory: Negative.    Cardiovascular: Negative.   Gastrointestinal: Negative.   Genitourinary: Negative.   Musculoskeletal:   Positive for back pain and joint pain.  Skin: Negative.   Neurological: Negative.   Endo/Heme/Allergies: Negative.        Objective:   BP (!) 128/90   Pulse 78   Ht 6' (1.829 m)   Wt 284 lb 6.4 oz (129 kg)   SpO2 98%   BMI 38.57 kg/m   Vitals:   11/29/22 1306 11/29/22 1339  BP: (!) 120/90 (!) 128/90  Pulse: 78   Height: 6' (1.829 m)   Weight: 284 lb 6.4 oz (129 kg)   SpO2: 98%   BMI (Calculated): 38.56     Physical Exam Vitals reviewed.  Constitutional:      Appearance: Normal appearance.  HENT:     Head: Normocephalic.     Left Ear: There is no impacted cerumen.     Nose: Nose normal.     Mouth/Throat:     Mouth: Mucous membranes are moist.     Pharynx: No posterior oropharyngeal erythema.  Eyes:     Extraocular Movements: Extraocular movements intact.     Pupils: Pupils are equal, round, and reactive to light.  Cardiovascular:     Rate and Rhythm: Regular rhythm.     Chest Wall: PMI is not displaced.     Pulses: Normal pulses.  Heart sounds: Normal heart sounds. No murmur heard. Pulmonary:     Effort: Pulmonary effort is normal.     Breath sounds: Normal air entry. No rhonchi or rales.  Abdominal:     General: Abdomen is flat. Bowel sounds are normal. There is no distension.     Palpations: Abdomen is soft. There is no hepatomegaly, splenomegaly or mass.     Tenderness: There is no abdominal tenderness.  Musculoskeletal:        General: Normal range of motion.     Cervical back: Normal range of motion and neck supple.     Right lower leg: No edema.     Left lower leg: No edema.  Skin:    General: Skin is warm and dry.  Neurological:     General: No focal deficit present.     Mental Status: He is alert and oriented to person, place, and time.     Cranial Nerves: No cranial nerve deficit.     Motor: No weakness.  Psychiatric:        Mood and Affect: Mood normal.        Behavior: Behavior normal.      No results found for any visits on  11/29/22.  Recent Results (from the past 2160 hour(Maysa Lynn))  Comprehensive metabolic panel     Status: Abnormal   Collection Time: 11/05/22  8:47 AM  Result Value Ref Range   Glucose 100 (H) 70 - 99 mg/dL   BUN 10 6 - 24 mg/dL   Creatinine, Ser 1.61 0.76 - 1.27 mg/dL   eGFR 096 >04 VW/UJW/1.19   BUN/Creatinine Ratio 12 9 - 20   Sodium 143 134 - 144 mmol/L   Potassium 4.1 3.5 - 5.2 mmol/L   Chloride 103 96 - 106 mmol/L   CO2 25 20 - 29 mmol/L   Calcium 9.3 8.7 - 10.2 mg/dL   Total Protein 7.2 6.0 - 8.5 g/dL   Albumin 4.2 3.8 - 4.9 g/dL   Globulin, Total 3.0 1.5 - 4.5 g/dL   Albumin/Globulin Ratio 1.4 1.2 - 2.2   Bilirubin Total 0.3 0.0 - 1.2 mg/dL   Alkaline Phosphatase 92 44 - 121 IU/L   AST 25 0 - 40 IU/L   ALT 22 0 - 44 IU/L  Lipid panel     Status: Abnormal   Collection Time: 11/05/22  8:47 AM  Result Value Ref Range   Cholesterol, Total 187 100 - 199 mg/dL   Triglycerides 147 (H) 0 - 149 mg/dL   HDL 36 (L) >82 mg/dL   VLDL Cholesterol Cal 35 5 - 40 mg/dL   LDL Chol Calc (NIH) 956 (H) 0 - 99 mg/dL   Chol/HDL Ratio 5.2 (H) 0.0 - 5.0 ratio    Comment:                                   T. Chol/HDL Ratio                                             Men  Women                               1/2 Avg.Risk  3.4    3.3  Avg.Risk  5.0    4.4                                2X Avg.Risk  9.6    7.1                                3X Avg.Risk 23.4   11.0   CBC With Diff/Platelet     Status: Abnormal   Collection Time: 11/05/22  8:47 AM  Result Value Ref Range   WBC 5.1 3.4 - 10.8 x10E3/uL   RBC 5.39 4.14 - 5.80 x10E6/uL   Hemoglobin 14.1 13.0 - 17.7 g/dL   Hematocrit 16.1 09.6 - 51.0 %   MCV 80 79 - 97 fL   MCH 26.2 (L) 26.6 - 33.0 pg   MCHC 32.6 31.5 - 35.7 g/dL   RDW 04.5 40.9 - 81.1 %   Platelets 359 150 - 450 x10E3/uL   Neutrophils 43 Not Estab. %   Lymphs 44 Not Estab. %   Monocytes 8 Not Estab. %   Eos 4 Not Estab. %   Basos 1 Not Estab. %    Neutrophils Absolute 2.2 1.4 - 7.0 x10E3/uL   Lymphocytes Absolute 2.2 0.7 - 3.1 x10E3/uL   Monocytes Absolute 0.4 0.1 - 0.9 x10E3/uL   EOS (ABSOLUTE) 0.2 0.0 - 0.4 x10E3/uL   Basophils Absolute 0.0 0.0 - 0.2 x10E3/uL   Immature Granulocytes 0 Not Estab. %   Immature Grans (Abs) 0.0 0.0 - 0.1 x10E3/uL  PSA     Status: None   Collection Time: 11/05/22  8:47 AM  Result Value Ref Range   Prostate Specific Ag, Serum 0.9 0.0 - 4.0 ng/mL    Comment: Roche ECLIA methodology. According to the American Urological Association, Serum PSA should decrease and remain at undetectable levels after radical prostatectomy. The AUA defines biochemical recurrence as an initial PSA value 0.2 ng/mL or greater followed by a subsequent confirmatory PSA value 0.2 ng/mL or greater. Values obtained with different assay methods or kits cannot be used interchangeably. Results cannot be interpreted as absolute evidence of the presence or absence of malignant disease.       Assessment & Plan:  As per problem list. Keep a BP log. Stricter low calorie diet, low cholesterol and low fat diet and exercise as much as possible   Problem List Items Addressed This Visit       Cardiovascular and Mediastinum   Primary hypertension - Primary     Other   Class 2 obesity due to excess calories without serious comorbidity with body mass index (BMI) of 39.0 to 39.9 in adult   Prediabetes   Mixed hyperlipidemia    Return in about 2 weeks (around 12/13/2022) for BP followup.   Total time spent: 35 minutes  Luna Fuse, MD  11/29/2022   This document may have been prepared by [facility] Voice Recognition software and as such may include unintentional dictation errors.

## 2022-12-13 ENCOUNTER — Ambulatory Visit (INDEPENDENT_AMBULATORY_CARE_PROVIDER_SITE_OTHER): Payer: BC Managed Care – PPO | Admitting: Internal Medicine

## 2022-12-13 VITALS — BP 140/110 | HR 83 | Ht 72.0 in | Wt 284.0 lb

## 2022-12-13 DIAGNOSIS — I10 Essential (primary) hypertension: Secondary | ICD-10-CM

## 2022-12-13 MED ORDER — VALSARTAN-HYDROCHLOROTHIAZIDE 160-12.5 MG PO TABS: 1.0000 | ORAL_TABLET | Freq: Every day | ORAL | 0 refills | Status: DC

## 2022-12-13 NOTE — Progress Notes (Signed)
Established Patient Office Visit  Subjective:  Patient ID: Alan Nguyen, male    DOB: August 16, 1970  Age: 52 y.o. MRN: [id number]  Chief Complaint  Patient presents with   Follow-up    BP Follow Up    No new complaints, here for BP follow up. BP high today denies headaches or dizziness and admits to stressful day at work. Fully compliant with his meds and low salt diet. Back and joint pains have improved.    No other concerns at this time.   Past Medical History:  Diagnosis Date   Hypertension    Obesity     Past Surgical History:  Procedure Laterality Date   TOOTH EXTRACTION      Social History   Socioeconomic History   Marital status: Married    Spouse name: Not on file   Number of children: Not on file   Years of education: Not on file   Highest education level: Not on file  Occupational History   Not on file  Tobacco Use   Smoking status: Never   Smokeless tobacco: Never  Vaping Use   Vaping Use: Never used  Substance and Sexual Activity   Alcohol use: Yes    Comment: socially   Drug use: No   Sexual activity: Not on file  Other Topics Concern   Not on file  Social History Narrative   Not on file   Social Determinants of Health   Financial Resource Strain: Not on file  Food Insecurity: Not on file  Transportation Needs: Not on file  Physical Activity: Not on file  Stress: Not on file  Social Connections: Not on file  Intimate Partner Violence: Not on file    Family History  Problem Relation Age of Onset   Cancer Father     No Known Allergies  Review of Systems  Constitutional: Negative.   HENT: Negative.    Eyes: Negative.   Respiratory: Negative.    Cardiovascular: Negative.   Gastrointestinal: Negative.   Genitourinary: Negative.   Musculoskeletal:  Positive for back pain and joint pain.  Skin: Negative.   Neurological: Negative.   Endo/Heme/Allergies: Negative.        Objective:   BP (!) 140/110   Pulse 83   Ht  6' (1.829 m)   Wt 284 lb (128.8 kg)   SpO2 98%   BMI 38.52 kg/m   Vitals:   12/13/22 1545  BP: (!) 140/110  Pulse: 83  Height: 6' (1.829 m)  Weight: 284 lb (128.8 kg)  SpO2: 98%  BMI (Calculated): 38.51    Physical Exam Vitals reviewed.  Constitutional:      Appearance: Normal appearance.  HENT:     Head: Normocephalic.     Left Ear: There is no impacted cerumen.     Nose: Nose normal.     Mouth/Throat:     Mouth: Mucous membranes are moist.     Pharynx: No posterior oropharyngeal erythema.  Eyes:     Extraocular Movements: Extraocular movements intact.     Pupils: Pupils are equal, round, and reactive to light.  Cardiovascular:     Rate and Rhythm: Regular rhythm.     Chest Wall: PMI is not displaced.     Pulses: Normal pulses.     Heart sounds: Normal heart sounds. No murmur heard. Pulmonary:     Effort: Pulmonary effort is normal.     Breath sounds: Normal air entry. No rhonchi or rales.  Abdominal:  General: Abdomen is flat. Bowel sounds are normal. There is no distension.     Palpations: Abdomen is soft. There is no hepatomegaly, splenomegaly or mass.     Tenderness: There is no abdominal tenderness.  Musculoskeletal:        General: Normal range of motion.     Cervical back: Normal range of motion and neck supple.     Right lower leg: No edema.     Left lower leg: No edema.  Skin:    General: Skin is warm and dry.  Neurological:     General: No focal deficit present.     Mental Status: He is alert and oriented to person, place, and time.     Cranial Nerves: No cranial nerve deficit.     Motor: No weakness.  Psychiatric:        Mood and Affect: Mood normal.        Behavior: Behavior normal.      No results found for any visits on 12/13/22.      Assessment & Plan:  As per problem list & increase Valsartan.  Problem List Items Addressed This Visit       Cardiovascular and Mediastinum   Primary hypertension - Primary   Relevant  Medications   valsartan-hydrochlorothiazide (DIOVAN HCT) 160-12.5 MG tablet    Return in about 3 weeks (around 01/03/2023) for BP followup.   Total time spent: 20 minutes  Luna Fuse, MD  12/13/2022   This document may have been prepared by [facility] Voice Recognition software and as such may include unintentional dictation errors.

## 2023-01-03 ENCOUNTER — Ambulatory Visit (INDEPENDENT_AMBULATORY_CARE_PROVIDER_SITE_OTHER): Payer: BC Managed Care – PPO | Admitting: Internal Medicine

## 2023-01-03 ENCOUNTER — Encounter: Payer: Self-pay | Admitting: Internal Medicine

## 2023-01-03 VITALS — BP 129/80 | HR 78 | Ht 72.0 in | Wt 280.2 lb

## 2023-01-03 DIAGNOSIS — Z6839 Body mass index (BMI) 39.0-39.9, adult: Secondary | ICD-10-CM | POA: Diagnosis not present

## 2023-01-03 DIAGNOSIS — E6609 Other obesity due to excess calories: Secondary | ICD-10-CM | POA: Diagnosis not present

## 2023-01-03 DIAGNOSIS — I10 Essential (primary) hypertension: Secondary | ICD-10-CM

## 2023-01-03 DIAGNOSIS — E782 Mixed hyperlipidemia: Secondary | ICD-10-CM | POA: Diagnosis not present

## 2023-01-03 MED ORDER — VALSARTAN-HYDROCHLOROTHIAZIDE 160-12.5 MG PO TABS: 1.0000 | ORAL_TABLET | Freq: Every day | ORAL | 1 refills | Status: DC

## 2023-01-03 NOTE — Progress Notes (Signed)
Established Patient Office Visit  Subjective:  Patient ID: Alan Nguyen, male    DOB: 1970-08-09  Age: 52 y.o. MRN: [id number]  Chief Complaint  Patient presents with   Follow-up    3 week f/u    Here for BP f/u, bp now normalized. Compliant with meds, diet, increased his exercise and lesss stressed at work.    No other concerns at this time.   Past Medical History:  Diagnosis Date   Hypertension    Obesity     Past Surgical History:  Procedure Laterality Date   TOOTH EXTRACTION      Social History   Socioeconomic History   Marital status: Married    Spouse name: Not on file   Number of children: Not on file   Years of education: Not on file   Highest education level: Not on file  Occupational History   Not on file  Tobacco Use   Smoking status: Never   Smokeless tobacco: Never  Vaping Use   Vaping status: Never Used  Substance and Sexual Activity   Alcohol use: Yes    Comment: socially   Drug use: No   Sexual activity: Not on file  Other Topics Concern   Not on file  Social History Narrative   Not on file   Social Determinants of Health   Financial Resource Strain: Not on file  Food Insecurity: Not on file  Transportation Needs: Not on file  Physical Activity: Not on file  Stress: Not on file  Social Connections: Not on file  Intimate Partner Violence: Not on file    Family History  Problem Relation Age of Onset   Cancer Father     No Known Allergies  Review of Systems  Constitutional:  Positive for weight loss (4 lbs).  HENT: Negative.    Eyes: Negative.   Respiratory: Negative.    Cardiovascular: Negative.   Gastrointestinal: Negative.   Genitourinary: Negative.   Musculoskeletal:  Positive for back pain and joint pain.  Skin: Negative.   Neurological: Negative.   Endo/Heme/Allergies: Negative.        Objective:   BP 129/80   Pulse 78   Ht 6' (1.829 m)   Wt 280 lb 3.2 oz (127.1 kg)   SpO2 96%   BMI 38.00 kg/m    Vitals:   01/03/23 1550  BP: 129/80  Pulse: 78  Height: 6' (1.829 m)  Weight: 280 lb 3.2 oz (127.1 kg)  SpO2: 96%  BMI (Calculated): 37.99    Physical Exam Vitals reviewed.  Constitutional:      Appearance: Normal appearance.  HENT:     Head: Normocephalic.     Left Ear: There is no impacted cerumen.     Nose: Nose normal.     Mouth/Throat:     Mouth: Mucous membranes are moist.     Pharynx: No posterior oropharyngeal erythema.  Eyes:     Extraocular Movements: Extraocular movements intact.     Pupils: Pupils are equal, round, and reactive to light.  Cardiovascular:     Rate and Rhythm: Regular rhythm.     Chest Wall: PMI is not displaced.     Pulses: Normal pulses.     Heart sounds: Normal heart sounds. No murmur heard. Pulmonary:     Effort: Pulmonary effort is normal.     Breath sounds: Normal air entry. No rhonchi or rales.  Abdominal:     General: Abdomen is flat. Bowel sounds are normal. There is  no distension.     Palpations: Abdomen is soft. There is no hepatomegaly, splenomegaly or mass.     Tenderness: There is no abdominal tenderness.  Musculoskeletal:        General: Normal range of motion.     Cervical back: Normal range of motion and neck supple.     Right lower leg: No edema.     Left lower leg: No edema.  Skin:    General: Skin is warm and dry.  Neurological:     General: No focal deficit present.     Mental Status: He is alert and oriented to person, place, and time.     Cranial Nerves: No cranial nerve deficit.     Motor: No weakness.  Psychiatric:        Mood and Affect: Mood normal.        Behavior: Behavior normal.      No results found for any visits on 01/03/23.      Assessment & Plan:  As per problem list  Problem List Items Addressed This Visit       Cardiovascular and Mediastinum   Primary hypertension - Primary     Other   Class 2 obesity due to excess calories without serious comorbidity with body mass index (BMI) of  39.0 to 39.9 in adult   Mixed hyperlipidemia   Relevant Orders   Comprehensive metabolic panel   Lipid panel    Return in about 5 weeks (around 02/07/2023) for fu with labs prior.   Total time spent: 20 minutes  Luna Fuse, MD  01/03/2023   This document may have been prepared by [facility] Voice Recognition software and as such may include unintentional dictation errors.

## 2023-01-10 ENCOUNTER — Other Ambulatory Visit: Payer: Self-pay | Admitting: Internal Medicine

## 2023-01-10 DIAGNOSIS — I10 Essential (primary) hypertension: Secondary | ICD-10-CM

## 2023-01-31 ENCOUNTER — Other Ambulatory Visit: Payer: BC Managed Care – PPO

## 2023-02-01 LAB — LIPID PANEL
Chol/HDL Ratio: 5.7 ratio — ABNORMAL HIGH (ref 0.0–5.0)
Triglycerides: 216 mg/dL — ABNORMAL HIGH (ref 0–149)

## 2023-02-01 LAB — COMPREHENSIVE METABOLIC PANEL
Calcium: 9.4 mg/dL (ref 8.7–10.2)
Sodium: 143 mmol/L (ref 134–144)
Total Protein: 7.6 g/dL (ref 6.0–8.5)

## 2023-02-11 ENCOUNTER — Ambulatory Visit: Payer: BC Managed Care – PPO | Admitting: Internal Medicine

## 2023-03-28 ENCOUNTER — Ambulatory Visit (INDEPENDENT_AMBULATORY_CARE_PROVIDER_SITE_OTHER): Payer: BC Managed Care – PPO | Admitting: Internal Medicine

## 2023-03-28 VITALS — BP 140/98 | HR 85 | Ht 72.0 in | Wt 290.0 lb

## 2023-03-28 DIAGNOSIS — E6609 Other obesity due to excess calories: Secondary | ICD-10-CM | POA: Diagnosis not present

## 2023-03-28 DIAGNOSIS — E782 Mixed hyperlipidemia: Secondary | ICD-10-CM

## 2023-03-28 DIAGNOSIS — E66812 Obesity, class 2: Secondary | ICD-10-CM | POA: Diagnosis not present

## 2023-03-28 DIAGNOSIS — I10 Essential (primary) hypertension: Secondary | ICD-10-CM

## 2023-03-28 DIAGNOSIS — Z6839 Body mass index (BMI) 39.0-39.9, adult: Secondary | ICD-10-CM

## 2023-03-28 NOTE — Progress Notes (Signed)
Established Patient Office Visit  Subjective:  Patient ID: Alan Nguyen, male    DOB: 11-11-1970  Age: 52 y.o. MRN: [id number]  Chief Complaint  Patient presents with   Follow-up    5 Weeks Follow Up Labs    No new complaints, here for lab review and medication refills. LDL and TC well controlled on lab review. Triglycerides also satisfactory  BP elevated today and admits to a few elevated readings at home over the last 2 wks.Admits to increase caffeine intake recently.  No other concerns at this time.   Past Medical History:  Diagnosis Date   Hypertension    Obesity     Past Surgical History:  Procedure Laterality Date   TOOTH EXTRACTION      Social History   Socioeconomic History   Marital status: Married    Spouse name: Not on file   Number of children: Not on file   Years of education: Not on file   Highest education level: Not on file  Occupational History   Not on file  Tobacco Use   Smoking status: Never   Smokeless tobacco: Never  Vaping Use   Vaping status: Never Used  Substance and Sexual Activity   Alcohol use: Yes    Comment: socially   Drug use: No   Sexual activity: Not on file  Other Topics Concern   Not on file  Social History Narrative   Not on file   Social Determinants of Health   Financial Resource Strain: Not on file  Food Insecurity: Not on file  Transportation Needs: Not on file  Physical Activity: Not on file  Stress: Not on file  Social Connections: Not on file  Intimate Partner Violence: Not on file    Family History  Problem Relation Age of Onset   Cancer Father     No Known Allergies  Review of Systems  Constitutional:  Negative for weight loss (Gained 6 lbs).  HENT: Negative.    Eyes: Negative.   Respiratory: Negative.    Cardiovascular: Negative.   Gastrointestinal: Negative.   Genitourinary: Negative.   Musculoskeletal:  Positive for back pain and joint pain.  Skin: Negative.   Neurological:  Negative.   Endo/Heme/Allergies: Negative.        Objective:   BP (!) 140/98   Pulse 85   Ht 6' (1.829 m)   Wt 290 lb (131.5 kg)   SpO2 95%   BMI 39.33 kg/m   Vitals:   03/28/23 1517 03/28/23 1539  BP: (!) 128/92 (!) 140/98  Pulse: 85   Height: 6' (1.829 m)   Weight: 290 lb (131.5 kg)   SpO2: 95%   BMI (Calculated): 39.32     Physical Exam Vitals reviewed.  Constitutional:      Appearance: Normal appearance.  HENT:     Head: Normocephalic.     Left Ear: There is no impacted cerumen.     Nose: Nose normal.     Mouth/Throat:     Mouth: Mucous membranes are moist.     Pharynx: No posterior oropharyngeal erythema.  Eyes:     Extraocular Movements: Extraocular movements intact.     Pupils: Pupils are equal, round, and reactive to light.  Cardiovascular:     Rate and Rhythm: Regular rhythm.     Chest Wall: PMI is not displaced.     Pulses: Normal pulses.     Heart sounds: Normal heart sounds. No murmur heard. Pulmonary:     Effort:  Pulmonary effort is normal.     Breath sounds: Normal air entry. No rhonchi or rales.  Abdominal:     General: Abdomen is flat. Bowel sounds are normal. There is no distension.     Palpations: Abdomen is soft. There is no hepatomegaly, splenomegaly or mass.     Tenderness: There is no abdominal tenderness.  Musculoskeletal:        General: Normal range of motion.     Cervical back: Normal range of motion and neck supple.     Right lower leg: No edema.     Left lower leg: No edema.  Skin:    General: Skin is warm and dry.  Neurological:     General: No focal deficit present.     Mental Status: He is alert and oriented to person, place, and time.     Cranial Nerves: No cranial nerve deficit.     Motor: No weakness.  Psychiatric:        Mood and Affect: Mood normal.        Behavior: Behavior normal.      No results found for any visits on 03/28/23.  Recent Results (from the past 2160 hour(Farrie Sann))  Comprehensive metabolic panel      Status: None   Collection Time: 01/31/23  4:06 PM  Result Value Ref Range   Glucose 94 70 - 99 mg/dL   BUN 12 6 - 24 mg/dL   Creatinine, Ser 2.13 0.76 - 1.27 mg/dL   eGFR 086 >57 QI/ONG/2.95   BUN/Creatinine Ratio 14 9 - 20   Sodium 143 134 - 144 mmol/L   Potassium 3.9 3.5 - 5.2 mmol/L   Chloride 103 96 - 106 mmol/L   CO2 26 20 - 29 mmol/L   Calcium 9.4 8.7 - 10.2 mg/dL   Total Protein 7.6 6.0 - 8.5 g/dL   Albumin 4.3 3.8 - 4.9 g/dL   Globulin, Total 3.3 1.5 - 4.5 g/dL   Bilirubin Total <2.8 0.0 - 1.2 mg/dL   Alkaline Phosphatase 90 44 - 121 IU/L   AST 16 0 - 40 IU/L   ALT 14 0 - 44 IU/L  Lipid panel     Status: Abnormal   Collection Time: 01/31/23  4:06 PM  Result Value Ref Range   Cholesterol, Total 200 (H) 100 - 199 mg/dL   Triglycerides 413 (H) 0 - 149 mg/dL   HDL 35 (L) >24 mg/dL   VLDL Cholesterol Cal 39 5 - 40 mg/dL   LDL Chol Calc (NIH) 401 (H) 0 - 99 mg/dL   Chol/HDL Ratio 5.7 (H) 0.0 - 5.0 ratio    Comment:                                   T. Chol/HDL Ratio                                             Men  Women                               1/2 Avg.Risk  3.4    3.3  Avg.Risk  5.0    4.4                                2X Avg.Risk  9.6    7.1                                3X Avg.Risk 23.4   11.0       Assessment & Plan:  As per problem list.   Problem List Items Addressed This Visit       Cardiovascular and Mediastinum   Primary hypertension - Primary     Other   Class 2 obesity due to excess calories without serious comorbidity with body mass index (BMI) of 39.0 to 39.9 in adult   Mixed hyperlipidemia    Return in about 3 weeks (around 04/18/2023) for BP followup.   Total time spent: 20 minutes  Luna Fuse, MD  03/28/2023   This document may have been prepared by [facility] Voice Recognition software and as such may include unintentional dictation errors.

## 2023-04-18 ENCOUNTER — Encounter: Payer: Self-pay | Admitting: Internal Medicine

## 2023-04-18 ENCOUNTER — Ambulatory Visit (INDEPENDENT_AMBULATORY_CARE_PROVIDER_SITE_OTHER): Payer: BC Managed Care – PPO | Admitting: Internal Medicine

## 2023-04-18 VITALS — BP 122/90 | HR 98 | Ht 72.0 in | Wt 290.2 lb

## 2023-04-18 DIAGNOSIS — I10 Essential (primary) hypertension: Secondary | ICD-10-CM

## 2023-04-18 MED ORDER — VALSARTAN-HYDROCHLOROTHIAZIDE 320-12.5 MG PO TABS: 1.0000 | ORAL_TABLET | Freq: Every day | ORAL | 0 refills | Status: DC

## 2023-04-18 NOTE — Progress Notes (Signed)
Established Patient Office Visit  Subjective:  Patient ID: Alan Nguyen, male    DOB: 02/20/1971  Age: 52 y.o. MRN: [id number]  Chief Complaint  Patient presents with   Follow-up    3 week follow up, BP check.    No new complaints, here for BP follow up and medication refills.  No other concerns at this time.   Past Medical History:  Diagnosis Date   Hypertension    Obesity     Past Surgical History:  Procedure Laterality Date   TOOTH EXTRACTION      Social History   Socioeconomic History   Marital status: Married    Spouse name: Not on file   Number of children: Not on file   Years of education: Not on file   Highest education level: Not on file  Occupational History   Not on file  Tobacco Use   Smoking status: Never   Smokeless tobacco: Never  Vaping Use   Vaping status: Never Used  Substance and Sexual Activity   Alcohol use: Yes    Comment: socially   Drug use: No   Sexual activity: Not on file  Other Topics Concern   Not on file  Social History Narrative   Not on file   Social Determinants of Health   Financial Resource Strain: Not on file  Food Insecurity: Not on file  Transportation Needs: Not on file  Physical Activity: Not on file  Stress: Not on file  Social Connections: Not on file  Intimate Partner Violence: Not on file    Family History  Problem Relation Age of Onset   Cancer Father     No Known Allergies  Review of Systems  Respiratory:  Negative for sputum production.   Cardiovascular:  Negative for chest pain, palpitations and orthopnea.  Neurological:  Negative for dizziness and headaches.  All other systems reviewed and are negative.      Objective:   BP (!) 122/90   Pulse 98   Ht 6' (1.829 m)   Wt 290 lb 3.2 oz (131.6 kg)   SpO2 96%   BMI 39.36 kg/m   Vitals:   04/18/23 1504  BP: (!) 122/90  Pulse: 98  Height: 6' (1.829 m)  Weight: 290 lb 3.2 oz (131.6 kg)  SpO2: 96%  BMI (Calculated): 39.35     Physical Exam Vitals reviewed.  Constitutional:      Appearance: Normal appearance. He is obese.  HENT:     Head: Normocephalic.     Left Ear: There is no impacted cerumen.     Nose: Nose normal.     Mouth/Throat:     Mouth: Mucous membranes are moist.     Pharynx: No posterior oropharyngeal erythema.  Eyes:     Extraocular Movements: Extraocular movements intact.     Pupils: Pupils are equal, round, and reactive to light.  Cardiovascular:     Rate and Rhythm: Regular rhythm.     Chest Wall: PMI is not displaced.     Pulses: Normal pulses.     Heart sounds: Normal heart sounds. No murmur heard. Pulmonary:     Effort: Pulmonary effort is normal.     Breath sounds: Normal air entry. No rhonchi or rales.  Abdominal:     General: Abdomen is flat. Bowel sounds are normal. There is no distension.     Palpations: Abdomen is soft. There is no hepatomegaly, splenomegaly or mass.     Tenderness: There is no abdominal  tenderness.  Musculoskeletal:        General: Normal range of motion.     Cervical back: Normal range of motion and neck supple.     Right lower leg: No edema.     Left lower leg: No edema.  Skin:    General: Skin is warm and dry.  Neurological:     General: No focal deficit present.     Mental Status: He is alert and oriented to person, place, and time.     Cranial Nerves: No cranial nerve deficit.     Motor: No weakness.  Psychiatric:        Mood and Affect: Mood normal.        Behavior: Behavior normal.      No results found for any visits on 04/18/23.  Recent Results (from the past 2160 hour(Draxton Luu))  Comprehensive metabolic panel     Status: None   Collection Time: 01/31/23  4:06 PM  Result Value Ref Range   Glucose 94 70 - 99 mg/dL   BUN 12 6 - 24 mg/dL   Creatinine, Ser 1.61 0.76 - 1.27 mg/dL   eGFR 096 >04 VW/UJW/1.19   BUN/Creatinine Ratio 14 9 - 20   Sodium 143 134 - 144 mmol/L   Potassium 3.9 3.5 - 5.2 mmol/L   Chloride 103 96 - 106 mmol/L    CO2 26 20 - 29 mmol/L   Calcium 9.4 8.7 - 10.2 mg/dL   Total Protein 7.6 6.0 - 8.5 g/dL   Albumin 4.3 3.8 - 4.9 g/dL   Globulin, Total 3.3 1.5 - 4.5 g/dL   Bilirubin Total <1.4 0.0 - 1.2 mg/dL   Alkaline Phosphatase 90 44 - 121 IU/L   AST 16 0 - 40 IU/L   ALT 14 0 - 44 IU/L  Lipid panel     Status: Abnormal   Collection Time: 01/31/23  4:06 PM  Result Value Ref Range   Cholesterol, Total 200 (H) 100 - 199 mg/dL   Triglycerides 782 (H) 0 - 149 mg/dL   HDL 35 (L) >95 mg/dL   VLDL Cholesterol Cal 39 5 - 40 mg/dL   LDL Chol Calc (NIH) 621 (H) 0 - 99 mg/dL   Chol/HDL Ratio 5.7 (H) 0.0 - 5.0 ratio    Comment:                                   T. Chol/HDL Ratio                                             Men  Women                               1/2 Avg.Risk  3.4    3.3                                   Avg.Risk  5.0    4.4                                2X Avg.Risk  9.6    7.1  3X Avg.Risk 23.4   11.0       Assessment & Plan:  As per problem list. Increase arb. Problem List Items Addressed This Visit       Cardiovascular and Mediastinum   Primary hypertension - Primary   Relevant Medications   valsartan-hydrochlorothiazide (DIOVAN-HCT) 320-12.5 MG tablet    Return in about 3 weeks (around 05/09/2023) for BP followup.   Total time spent: 20 minutes  Luna Fuse, MD  04/18/2023   This document may have been prepared by [facility] Voice Recognition software and as such may include unintentional dictation errors.

## 2023-05-09 ENCOUNTER — Ambulatory Visit: Payer: BC Managed Care – PPO | Admitting: Internal Medicine

## 2023-05-16 ENCOUNTER — Other Ambulatory Visit: Payer: Self-pay | Admitting: Internal Medicine

## 2023-05-16 DIAGNOSIS — I10 Essential (primary) hypertension: Secondary | ICD-10-CM

## 2023-06-20 ENCOUNTER — Other Ambulatory Visit: Payer: Self-pay | Admitting: Internal Medicine

## 2023-06-20 DIAGNOSIS — I10 Essential (primary) hypertension: Secondary | ICD-10-CM

## 2023-07-24 ENCOUNTER — Other Ambulatory Visit: Payer: Self-pay | Admitting: Internal Medicine

## 2023-07-24 DIAGNOSIS — I10 Essential (primary) hypertension: Secondary | ICD-10-CM

## 2023-08-15 ENCOUNTER — Other Ambulatory Visit: Payer: Self-pay | Admitting: Internal Medicine

## 2023-08-15 DIAGNOSIS — I10 Essential (primary) hypertension: Secondary | ICD-10-CM

## 2023-09-13 ENCOUNTER — Other Ambulatory Visit: Payer: Self-pay | Admitting: Internal Medicine

## 2023-09-13 DIAGNOSIS — I10 Essential (primary) hypertension: Secondary | ICD-10-CM

## 2023-10-02 ENCOUNTER — Other Ambulatory Visit: Payer: Self-pay | Admitting: Internal Medicine

## 2023-10-02 DIAGNOSIS — I10 Essential (primary) hypertension: Secondary | ICD-10-CM

## 2023-10-25 ENCOUNTER — Other Ambulatory Visit: Payer: Self-pay | Admitting: Internal Medicine

## 2023-10-25 DIAGNOSIS — I10 Essential (primary) hypertension: Secondary | ICD-10-CM

## 2024-01-12 ENCOUNTER — Ambulatory Visit
Admission: EM | Admit: 2024-01-12 | Discharge: 2024-01-12 | Disposition: A | Discharge status: Home / Self Care | Attending: Emergency Medicine | Admitting: Emergency Medicine

## 2024-01-12 DIAGNOSIS — Z8679 Personal history of other diseases of the circulatory system: Secondary | ICD-10-CM | POA: Diagnosis present

## 2024-01-12 DIAGNOSIS — Z760 Encounter for issue of repeat prescription: Secondary | ICD-10-CM | POA: Diagnosis present

## 2024-01-12 DIAGNOSIS — I10 Essential (primary) hypertension: Secondary | ICD-10-CM | POA: Diagnosis present

## 2024-01-12 DIAGNOSIS — M109 Gout, unspecified: Secondary | ICD-10-CM | POA: Diagnosis present

## 2024-01-12 LAB — BASIC METABOLIC PANEL WITH GFR
Anion gap: 8 (ref 5–15)
BUN: 11 mg/dL (ref 6–20)
CO2: 29 mmol/L (ref 22–32)
Calcium: 9 mg/dL (ref 8.9–10.3)
Chloride: 103 mmol/L (ref 98–111)
Creatinine, Ser: 0.78 mg/dL (ref 0.61–1.24)
GFR, Estimated: >60 mL/min (ref >60)
Glucose, Bld: 98 mg/dL (ref 70–99)
Potassium: 3.7 mmol/L (ref 3.5–5.1)
Sodium: 140 mmol/L (ref 135–145)

## 2024-01-12 LAB — CBC WITH DIFFERENTIAL/PLATELET
Abs Immature Granulocytes: 0.01 K/uL (ref 0.00–0.07)
Basophils Absolute: 0 K/uL (ref 0.0–0.1)
Basophils Relative: 0 %
Eosinophils Absolute: 0.3 K/uL (ref 0.0–0.5)
Eosinophils Relative: 4 %
HCT: 42 % (ref 39.0–52.0)
Hemoglobin: 13.7 g/dL (ref 13.0–17.0)
Immature Granulocytes: 0 %
Lymphocytes Relative: 34 %
Lymphs Abs: 3.2 K/uL (ref 0.7–4.0)
MCH: 25.5 pg — ABNORMAL LOW (ref 26.0–34.0)
MCHC: 32.6 g/dL (ref 30.0–36.0)
MCV: 78.2 fL — ABNORMAL LOW (ref 80.0–100.0)
Monocytes Absolute: 0.8 K/uL (ref 0.1–1.0)
Monocytes Relative: 9 %
Neutro Abs: 4.9 K/uL (ref 1.7–7.7)
Neutrophils Relative %: 53 %
Platelets: 377 K/uL (ref 150–400)
RBC: 5.37 MIL/uL (ref 4.22–5.81)
RDW: 15.4 % (ref 11.5–15.5)
WBC: 9.2 K/uL (ref 4.0–10.5)
nRBC: 0 % (ref 0.0–0.2)

## 2024-01-12 LAB — URIC ACID: Uric Acid, Serum: 8.1 mg/dL (ref 3.7–8.6)

## 2024-01-12 MED ORDER — VALSARTAN-HYDROCHLOROTHIAZIDE 320-12.5 MG PO TABS: 1.0000 | ORAL_TABLET | Freq: Every day | ORAL | 0 refills | Status: DC

## 2024-01-12 MED ORDER — INDOMETHACIN 25 MG PO CAPS: 25.0000 mg | ORAL_CAPSULE | Freq: Two times a day (BID) | ORAL | 0 refills | Status: AC

## 2024-01-12 NOTE — ED Triage Notes (Addendum)
 Patient present to the office for left hand swelling and pain x  days.Patient denies any trauma to his hand.  Home Intervention: Motrin

## 2024-01-12 NOTE — ED Provider Notes (Signed)
 [facility]    CSN: [id number] Arrival date & time: 01/12/24  1408      History   Chief Complaint Chief Complaint  Patient presents with   Hand Problem    HPI Alan Nguyen is a 53 y.o. male.   53 year old male pt, Alan Nguyen, presents to urgent care for evaluation of left hand swelling and pain for 3 days.  Patient denies any trauma or injury to his hand.  Patient has been taken Motrin without relief.   PMH: (YUW:lwrnwumnoozi, out of meds),gout, obesity  The history is provided by the patient. No language interpreter was used.    Past Medical History:  Diagnosis Date   Hypertension    Obesity     Patient Active Problem List   Diagnosis Date Noted   History of hypertension 01/12/2024   Acute gout of left hand 01/12/2024   Encounter for medication refill 01/12/2024   Prediabetes 11/29/2022   Mixed hyperlipidemia 11/29/2022   Class 2 obesity due to excess calories without serious comorbidity with body mass index (BMI) of 39.0 to 39.9 in adult 08/31/2022   Primary hypertension 08/31/2022   Acute respiratory disease due to COVID-19 virus 08/21/2019   Chest pain 08/21/2019    Past Surgical History:  Procedure Laterality Date   TOOTH EXTRACTION         Home Medications    Prior to Admission medications   Medication Sig Start Date End Date Taking? Authorizing Provider  indomethacin  (INDOCIN ) 25 MG capsule Take 1 capsule (25 mg total) by mouth 2 (two) times daily with a meal for 5 days. 01/12/24 01/17/24 Yes Lannette Avellino, NP  valsartan -hydrochlorothiazide  (DIOVAN -HCT) 320-12.5 MG tablet Take 1 tablet by mouth daily. 01/12/24   Makyiah Lie, Rilla, NP    Family History Family History  Problem Relation Age of Onset   Cancer Father     Social History Social History   Tobacco Use   Smoking status: Never   Smokeless tobacco: Never  Vaping Use   Vaping status: Never Used  Substance Use Topics   Alcohol use: Yes    Comment: socially    Drug use: No     Allergies   Patient has no known allergies.   Review of Systems Review of Systems  Constitutional:  Negative for fever.  Musculoskeletal:  Positive for arthralgias, joint swelling and myalgias.  Skin: Negative.   All other systems reviewed and are negative.    Physical Exam Triage Vital Signs ED Triage Vitals  Encounter Vitals Group     BP 01/12/24 1417 (!) 170/104     Girls Systolic BP Percentile --      Girls Diastolic BP Percentile --      Boys Systolic BP Percentile --      Boys Diastolic BP Percentile --      Pulse Rate 01/12/24 1417 86     Resp 01/12/24 1417 18     Temp 01/12/24 1417 98.4 F (36.9 C)     Temp Source 01/12/24 1417 Oral     SpO2 01/12/24 1417 94 %     Weight --      Height --      Head Circumference --      Peak Flow --      Pain Score 01/12/24 1421 10     Pain Loc --      Pain Education --      Exclude from Growth Chart --    No data found.  Updated Vital Signs BP (!) 170/104 (BP Location: Left Arm)   Pulse 86   Temp 98.4 F (36.9 C) (Oral)   Resp 18   SpO2 94%   Visual Acuity Right Eye Distance:   Left Eye Distance:   Bilateral Distance:    Right Eye Near:   Left Eye Near:    Bilateral Near:     Physical Exam Vitals and nursing note reviewed.  Constitutional:      Appearance: Normal appearance. He is well-developed and well-groomed.  HENT:     Head: Normocephalic.  Cardiovascular:     Rate and Rhythm: Normal rate.     Pulses:          Radial pulses are 2+ on the right side and 2+ on the left side.  Musculoskeletal:     Right hand: Swelling and tenderness present. Decreased range of motion. Normal pulse.     Comments: Left dorsal hand swelling, + tenderness, no wound, no streaking  Neurological:     General: No focal deficit present.     Mental Status: He is alert and oriented to person, place, and time.     GCS: GCS eye subscore is 4. GCS verbal subscore is 5. GCS motor subscore is 6.  Psychiatric:         Attention and Perception: Attention normal.        Mood and Affect: Mood normal.        Speech: Speech normal.        Behavior: Behavior is cooperative.      UC Treatments / Results  Labs (all labs ordered are listed, but only abnormal results are displayed) Labs Reviewed  CBC WITH DIFFERENTIAL/PLATELET - Abnormal; Notable for the following components:      Result Value   MCV 78.2 (*)    MCH 25.5 (*)    All other components within normal limits  URIC ACID  BASIC METABOLIC PANEL WITH GFR    EKG   Radiology No results found.  Procedures Procedures (including critical care time)  Medications Ordered in UC Medications - No data to display  Initial Impression / Assessment and Plan / UC Course  I have reviewed the triage vital signs and the nursing notes.  Pertinent labs & imaging results that were available during my care of the patient were reviewed by me and considered in my medical decision making (see chart for details).  Clinical Course as of 01/12/24 2001  Thu Jan 12, 2024  1527 CBC, BMP, Uric acid results reviewed will write 30 day supply of meds, must follow up with PCP for further BP management, refills. Avoid alcohol, low purine diet handout given, watch salt intake, indomethacin  25 mg po bid x 5 days for pain. [JD]    Clinical Course User Index [JD] Toshie Demelo, Rilla, NP  Discussed exam findings and plan of care with patient, refilled blood pressure medicine , referred to PCP for further management strict go to ER precautions given.   Patient verbalized understanding to this provider.  Ddx: Gout, HTN,cellulitis Final Clinical Impressions(s) / UC Diagnoses   Final diagnoses:  History of hypertension  Acute gout of left hand, unspecified cause  Encounter for medication refill     Discharge Instructions      Call PCP to schedule a follow up appt-We have refilled your BP med as a courtesy, we are unable to refill Take indomethacin  as prescribed  for gout pain, do not take additional NSAIDS(aleve, ibuprofen,etc) while taking this medication.  Avoid alcohol, low purine diet recommended, avoid excessive salt(DASH diet) If you have new or worsening issues(fever, nausea,muscle pain, etc) go to the emergency room for further evaluation    ED Prescriptions     Medication Sig Dispense Auth. Provider   valsartan -hydrochlorothiazide  (DIOVAN -HCT) 320-12.5 MG tablet Take 1 tablet by mouth daily. 30 tablet Vernis Cabacungan, NP   indomethacin  (INDOCIN ) 25 MG capsule Take 1 capsule (25 mg total) by mouth 2 (two) times daily with a meal for 5 days. 10 capsule Demetris Meinhardt, NP      PDMP not reviewed this encounter.   Aminta Loose, NP 01/12/24 2001

## 2024-01-12 NOTE — Discharge Instructions (Addendum)
 Call PCP to schedule a follow up appt-We have refilled your BP med as a courtesy, we are unable to refill Take indomethacin  as prescribed for gout pain, do not take additional NSAIDS(aleve, ibuprofen,etc) while taking this medication.  Avoid alcohol, low purine diet recommended, avoid excessive salt(DASH diet) If you have new or worsening issues(fever, nausea,muscle pain, etc) go to the emergency room for further evaluation

## 2024-01-26 ENCOUNTER — Other Ambulatory Visit

## 2024-01-26 DIAGNOSIS — R7303 Prediabetes: Secondary | ICD-10-CM

## 2024-01-26 DIAGNOSIS — E782 Mixed hyperlipidemia: Secondary | ICD-10-CM

## 2024-01-27 LAB — HEMOGLOBIN A1C
Est. average glucose Bld gHb Est-mCnc: 131 mg/dL
Hgb A1c MFr Bld: 6.2 % — ABNORMAL HIGH (ref 4.8–5.6)

## 2024-01-27 LAB — LIPID PANEL
Chol/HDL Ratio: 4.9 ratio (ref 0.0–5.0)
Cholesterol, Total: 176 mg/dL (ref 100–199)
HDL: 36 mg/dL — ABNORMAL LOW (ref >39)
LDL Chol Calc (NIH): 107 mg/dL — ABNORMAL HIGH (ref 0–99)
Triglycerides: 190 mg/dL — ABNORMAL HIGH (ref 0–149)
VLDL Cholesterol Cal: 33 mg/dL (ref 5–40)

## 2024-01-30 ENCOUNTER — Ambulatory Visit: Payer: Self-pay | Admitting: Internal Medicine

## 2024-01-30 ENCOUNTER — Ambulatory Visit (INDEPENDENT_AMBULATORY_CARE_PROVIDER_SITE_OTHER): Admitting: Internal Medicine

## 2024-01-30 ENCOUNTER — Encounter: Payer: Self-pay | Admitting: Internal Medicine

## 2024-01-30 VITALS — BP 134/80 | HR 89 | Ht 72.0 in | Wt 295.0 lb

## 2024-01-30 DIAGNOSIS — I10 Essential (primary) hypertension: Secondary | ICD-10-CM | POA: Diagnosis not present

## 2024-01-30 DIAGNOSIS — Z0001 Encounter for general adult medical examination with abnormal findings: Secondary | ICD-10-CM | POA: Diagnosis not present

## 2024-01-30 DIAGNOSIS — R7303 Prediabetes: Secondary | ICD-10-CM | POA: Diagnosis not present

## 2024-01-30 DIAGNOSIS — Z6841 Body Mass Index (BMI) 40.0 and over, adult: Secondary | ICD-10-CM

## 2024-01-30 DIAGNOSIS — N529 Male erectile dysfunction, unspecified: Secondary | ICD-10-CM

## 2024-01-30 DIAGNOSIS — M19032 Primary osteoarthritis, left wrist: Secondary | ICD-10-CM

## 2024-01-30 MED ORDER — INDOMETHACIN 50 MG PO CAPS: 50.0000 mg | ORAL_CAPSULE | Freq: Three times a day (TID) | ORAL | 2 refills | Status: AC | PRN

## 2024-01-30 MED ORDER — SILDENAFIL CITRATE 100 MG PO TABS: 100.0000 mg | ORAL_TABLET | ORAL | 2 refills | Status: AC | PRN

## 2024-01-30 NOTE — Progress Notes (Signed)
 Established Patient Office Visit  Subjective:  Patient ID: Alan Nguyen, male    DOB: 1971/01/11  Age: 53 y.o. MRN: [id number]  Chief Complaint  Patient presents with   Annual Exam    CPE lab results    C/o recent acute pain and swelling of his left wrist seen at [facility] and treated for acute gout but still c/o that his wrist is uncomfortable and swollen. Also here for CPE, lab review and medication refills. Labs reviewed and notable for improvement in TC and LDL although LDL not yet at target. A1c in prediabetic range.      No other concerns at this time.   Past Medical History:  Diagnosis Date   Hypertension    Obesity     Past Surgical History:  Procedure Laterality Date   TOOTH EXTRACTION      Social History   Socioeconomic History   Marital status: Married    Spouse name: Not on file   Number of children: Not on file   Years of education: Not on file   Highest education level: Not on file  Occupational History   Not on file  Tobacco Use   Smoking status: Never   Smokeless tobacco: Never  Vaping Use   Vaping status: Never Used  Substance and Sexual Activity   Alcohol use: Yes    Comment: socially   Drug use: No   Sexual activity: Yes  Other Topics Concern   Not on file  Social History Narrative   Not on file   Social Drivers of Health   Financial Resource Strain: Not on file  Food Insecurity: Not on file  Transportation Needs: Not on file  Physical Activity: Not on file  Stress: Not on file  Social Connections: Not on file  Intimate Partner Violence: Not on file    Family History  Problem Relation Age of Onset   Cancer Father    Cancer Brother    Prostate cancer Brother     No Known Allergies  Outpatient Medications Prior to Visit  Medication Sig   valsartan -hydrochlorothiazide  (DIOVAN -HCT) 320-12.5 MG tablet Take 1 tablet by mouth daily.   No facility-administered medications prior to visit.    Review of Systems   Respiratory:  Negative for sputum production.   Cardiovascular:  Negative for chest pain, palpitations and orthopnea.  Neurological:  Negative for dizziness and headaches.  All other systems reviewed and are negative.      Objective:   BP 134/80   Pulse 89   Ht 6' (1.829 m)   Wt 295 lb (133.8 kg)   SpO2 92%   BMI 40.01 kg/m   Vitals:   01/30/24 1307  BP: 134/80  Pulse: 89  Height: 6' (1.829 m)  Weight: 295 lb (133.8 kg)  SpO2: 92%  BMI (Calculated): 40    Physical Exam Vitals reviewed.  Constitutional:      Appearance: Normal appearance. He is obese.  HENT:     Head: Normocephalic.     Left Ear: There is no impacted cerumen.     Nose: Nose normal.     Mouth/Throat:     Mouth: Mucous membranes are moist.     Pharynx: No posterior oropharyngeal erythema.  Eyes:     Extraocular Movements: Extraocular movements intact.     Pupils: Pupils are equal, round, and reactive to light.  Cardiovascular:     Rate and Rhythm: Regular rhythm.     Chest Wall: PMI is not displaced.  Pulses: Normal pulses.     Heart sounds: Normal heart sounds. No murmur heard. Pulmonary:     Effort: Pulmonary effort is normal.     Breath sounds: Normal air entry. No rhonchi or rales.  Abdominal:     General: Abdomen is flat. Bowel sounds are normal. There is no distension.     Palpations: Abdomen is soft. There is no hepatomegaly, splenomegaly or mass.     Tenderness: There is no abdominal tenderness.  Musculoskeletal:        General: Normal range of motion.     Cervical back: Normal range of motion and neck supple.     Right lower leg: No edema.     Left lower leg: No edema.  Skin:    General: Skin is warm and dry.  Neurological:     General: No focal deficit present.     Mental Status: He is alert and oriented to person, place, and time.     Cranial Nerves: No cranial nerve deficit.     Motor: No weakness.  Psychiatric:        Mood and Affect: Mood normal.        Behavior:  Behavior normal.      No results found for any visits on 01/30/24.  Recent Results (from the past 2160 hours)  Uric acid     Status: None   Collection Time: 01/12/24  3:10 PM  Result Value Ref Range   Uric Acid, Serum 8.1 3.7 - 8.6 mg/dL    Comment: Performed at [facility], [address], [place], [place] [address]  Basic metabolic panel     Status: None   Collection Time: 01/12/24  3:10 PM  Result Value Ref Range   Sodium 140 135 - 145 mmol/L   Potassium 3.7 3.5 - 5.1 mmol/L   Chloride 103 98 - 111 mmol/L   CO2 29 22 - 32 mmol/L   Glucose, Bld 98 70 - 99 mg/dL    Comment: Glucose reference range applies only to samples taken after fasting for at least 8 hours.   BUN 11 6 - 20 mg/dL   Creatinine, Ser 9.21 0.61 - 1.24 mg/dL   Calcium 9.0 8.9 - 89.6 mg/dL   GFR, Estimated >39 >39 mL/min    Comment: (NOTE) Calculated using the CKD-EPI Creatinine Equation (2021)    Anion gap 8 5 - 15    Comment: Performed at [facility], [address] [place], [place] [address]  CBC with Differential     Status: Abnormal   Collection Time: 01/12/24  3:10 PM  Result Value Ref Range   WBC 9.2 4.0 - 10.5 K/uL   RBC 5.37 4.22 - 5.81 MIL/uL   Hemoglobin 13.7 13.0 - 17.0 g/dL   HCT 57.9 60.9 - 47.9 %   MCV 78.2 (L) 80.0 - 100.0 fL   MCH 25.5 (L) 26.0 - 34.0 pg   MCHC 32.6 30.0 - 36.0 g/dL   RDW 84.5 88.4 - 84.4 %   Platelets 377 150 - 400 K/uL   nRBC 0.0 0.0 - 0.2 %   Neutrophils Relative % 53 %   Neutro Abs 4.9 1.7 - 7.7 K/uL   Lymphocytes Relative 34 %   Lymphs Abs 3.2 0.7 - 4.0 K/uL   Monocytes Relative 9 %   Monocytes Absolute 0.8 0.1 - 1.0 K/uL   Eosinophils Relative 4 %   Eosinophils Absolute 0.3 0.0 - 0.5 K/uL   Basophils Relative 0 %  Basophils Absolute 0.0 0.0 - 0.1 K/uL   Immature Granulocytes 0 %   Abs Immature Granulocytes 0.01 0.00 - 0.07 K/uL    Comment: Performed at [facility], [address] [place], [place] [address]   Lipid Profile     Status: Abnormal   Collection Time: 01/26/24  3:37 PM  Result Value Ref Range   Cholesterol, Total 176 100 - 199 mg/dL   Triglycerides 809 (H) 0 - 149 mg/dL   HDL 36 (L) >60 mg/dL   VLDL Cholesterol Cal 33 5 - 40 mg/dL   LDL Chol Calc (NIH) 892 (H) 0 - 99 mg/dL   Chol/HDL Ratio 4.9 0.0 - 5.0 ratio    Comment:                                   T. Chol/HDL Ratio                                             Men  Women                               1/2 Avg.Risk  3.4    3.3                                   Avg.Risk  5.0    4.4                                2X Avg.Risk  9.6    7.1                                3X Avg.Risk 23.4   11.0   Hemoglobin A1c     Status: Abnormal   Collection Time: 01/26/24  3:37 PM  Result Value Ref Range   Hgb A1c MFr Bld 6.2 (H) 4.8 - 5.6 %    Comment:          Prediabetes: 5.7 - 6.4          Diabetes: >6.4          Glycemic control for adults with diabetes: <7.0    Est. average glucose Bld gHb Est-mCnc 131 mg/dL      Assessment & Plan:  Lorris was seen today for annual exam.  Encounter for general adult medical examination with abnormal findings    Problem List Items Addressed This Visit   None Visit Diagnoses       Encounter for general adult medical examination with abnormal findings    -  Primary       Return in about 3 months (around 05/01/2024) for fu with labs prior.   Total time spent: 20 minutes  Sherrill Cinderella Perry, MD  01/30/2024   This document may have been prepared by [facility] Voice Recognition software and as such may include unintentional dictation errors.

## 2024-05-01 ENCOUNTER — Ambulatory Visit: Admitting: Internal Medicine

## 2024-05-14 ENCOUNTER — Ambulatory Visit: Admitting: Internal Medicine

## 2024-05-28 ENCOUNTER — Ambulatory Visit: Admitting: Internal Medicine

## 2024-06-11 ENCOUNTER — Other Ambulatory Visit

## 2024-06-11 DIAGNOSIS — E782 Mixed hyperlipidemia: Secondary | ICD-10-CM

## 2024-06-11 DIAGNOSIS — R7303 Prediabetes: Secondary | ICD-10-CM

## 2024-06-12 LAB — LIPID PANEL
Chol/HDL Ratio: 4.7 ratio (ref 0.0–5.0)
Cholesterol, Total: 174 mg/dL (ref 100–199)
HDL: 37 mg/dL — ABNORMAL LOW
LDL Chol Calc (NIH): 107 mg/dL — ABNORMAL HIGH (ref 0–99)
Triglycerides: 173 mg/dL — ABNORMAL HIGH (ref 0–149)
VLDL Cholesterol Cal: 30 mg/dL (ref 5–40)

## 2024-06-12 LAB — HEMOGLOBIN A1C
Est. average glucose Bld gHb Est-mCnc: 137 mg/dL
Hgb A1c MFr Bld: 6.4 % — ABNORMAL HIGH (ref 4.8–5.6)

## 2024-06-18 ENCOUNTER — Ambulatory Visit: Admitting: Internal Medicine

## 2024-06-18 ENCOUNTER — Ambulatory Visit: Payer: Self-pay | Admitting: Internal Medicine

## 2024-06-18 ENCOUNTER — Encounter: Payer: Self-pay | Admitting: Internal Medicine

## 2024-06-18 VITALS — BP 142/92 | HR 113 | Temp 98.6°F | Ht 72.0 in | Wt 305.8 lb

## 2024-06-18 DIAGNOSIS — Z6841 Body Mass Index (BMI) 40.0 and over, adult: Secondary | ICD-10-CM

## 2024-06-18 DIAGNOSIS — N400 Enlarged prostate without lower urinary tract symptoms: Secondary | ICD-10-CM

## 2024-06-18 DIAGNOSIS — I10 Essential (primary) hypertension: Secondary | ICD-10-CM | POA: Diagnosis not present

## 2024-06-18 DIAGNOSIS — E782 Mixed hyperlipidemia: Secondary | ICD-10-CM | POA: Diagnosis not present

## 2024-06-18 DIAGNOSIS — R7303 Prediabetes: Secondary | ICD-10-CM | POA: Diagnosis not present

## 2024-06-18 MED ORDER — VALSARTAN-HYDROCHLOROTHIAZIDE 320-12.5 MG PO TABS: 1.0000 | ORAL_TABLET | Freq: Every day | ORAL | 0 refills | Status: AC

## 2024-06-18 NOTE — Patient Instructions (Signed)
 No new complaints, here for medication refills.

## 2024-06-18 NOTE — Progress Notes (Signed)
 "  Established Patient Office Visit  Subjective:  Patient ID: Alan Nguyen, male    DOB: 11-25-1970  Age: 54 y.o. MRN: [id number]  Chief Complaint  Patient presents with   Follow-up    3 month follow up and lab results     No new complaints, here for lab review and medication refills. Labs reviewed and notable for A1c of 6.4 and stable but elevated LDL.     No other concerns at this time.   Past Medical History:  Diagnosis Date   Hypertension    Obesity     Past Surgical History:  Procedure Laterality Date   TOOTH EXTRACTION      Social History   Socioeconomic History   Marital status: Married    Spouse name: Not on file   Number of children: Not on file   Years of education: Not on file   Highest education level: Not on file  Occupational History   Not on file  Tobacco Use   Smoking status: Never   Smokeless tobacco: Never  Vaping Use   Vaping status: Never Used  Substance and Sexual Activity   Alcohol use: Yes    Comment: socially   Drug use: No   Sexual activity: Yes  Other Topics Concern   Not on file  Social History Narrative   Not on file   Social Drivers of Health   Tobacco Use: Low Risk (06/18/2024)   Patient History    Smoking Tobacco Use: Never    Smokeless Tobacco Use: Never    Passive Exposure: Not on file  Financial Resource Strain: Not on file  Food Insecurity: Not on file  Transportation Needs: Not on file  Physical Activity: Not on file  Stress: Not on file  Social Connections: Not on file  Intimate Partner Violence: Not on file  Depression (PHQ2-9): Low Risk (11/29/2022)   Depression (PHQ2-9)    PHQ-2 Score: 0  Alcohol Screen: Not on file  Housing: Not on file  Utilities: Not on file  Health Literacy: Not on file    Family History  Problem Relation Age of Onset   Cancer Father    Cancer Brother    Prostate cancer Brother     Allergies[1]  Show/hide medication list[2]  Review of Systems  Constitutional:   Negative for weight loss (gained 10 lbs).  Respiratory:  Negative for sputum production.   Cardiovascular:  Negative for chest pain, palpitations and orthopnea.  Neurological:  Negative for dizziness and headaches.  All other systems reviewed and are negative.      Objective:   BP (!) 142/92   Pulse (!) 113   Temp 98.6 F (37 C)   Ht 6' (1.829 m)   Wt (!) 305 lb 12.8 oz (138.7 kg)   SpO2 96%   BMI 41.47 kg/m   Vitals:   06/18/24 1429  BP: (!) 142/92  Pulse: (!) 113  Temp: 98.6 F (37 C)  Height: 6' (1.829 m)  Weight: (!) 305 lb 12.8 oz (138.7 kg)  SpO2: 96%  BMI (Calculated): 41.46    Physical Exam Vitals reviewed.  Constitutional:      Appearance: Normal appearance. He is obese.  HENT:     Head: Normocephalic.     Left Ear: There is no impacted cerumen.     Nose: Nose normal.     Mouth/Throat:     Mouth: Mucous membranes are moist.     Pharynx: No posterior oropharyngeal erythema.  Eyes:  Extraocular Movements: Extraocular movements intact.     Pupils: Pupils are equal, round, and reactive to light.  Cardiovascular:     Rate and Rhythm: Regular rhythm.     Chest Wall: PMI is not displaced.     Pulses: Normal pulses.     Heart sounds: Normal heart sounds. No murmur heard. Pulmonary:     Effort: Pulmonary effort is normal.     Breath sounds: Normal air entry. No rhonchi or rales.  Abdominal:     General: Abdomen is flat. Bowel sounds are normal. There is no distension.     Palpations: Abdomen is soft. There is no hepatomegaly, splenomegaly or mass.     Tenderness: There is no abdominal tenderness.  Musculoskeletal:        General: Normal range of motion.     Cervical back: Normal range of motion and neck supple.     Right lower leg: No edema.     Left lower leg: No edema.  Skin:    General: Skin is warm and dry.  Neurological:     General: No focal deficit present.     Mental Status: He is alert and oriented to person, place, and time.      Cranial Nerves: No cranial nerve deficit.     Motor: No weakness.  Psychiatric:        Mood and Affect: Mood normal.        Behavior: Behavior normal.      No results found for any visits on 06/18/24.  Recent Results (from the past 2160 hours)  Hemoglobin A1c     Status: Abnormal   Collection Time: 06/11/24  3:23 PM  Result Value Ref Range   Hgb A1c MFr Bld 6.4 (H) 4.8 - 5.6 %    Comment:          Prediabetes: 5.7 - 6.4          Diabetes: >6.4          Glycemic control for adults with diabetes: <7.0    Est. average glucose Bld gHb Est-mCnc 137 mg/dL  Lipid Profile     Status: Abnormal   Collection Time: 06/11/24  3:23 PM  Result Value Ref Range   Cholesterol, Total 174 100 - 199 mg/dL   Triglycerides 826 (H) 0 - 149 mg/dL   HDL 37 (L) >60 mg/dL   VLDL Cholesterol Cal 30 5 - 40 mg/dL   LDL Chol Calc (NIH) 892 (H) 0 - 99 mg/dL   Chol/HDL Ratio 4.7 0.0 - 5.0 ratio    Comment:                                   T. Chol/HDL Ratio                                             Men  Women                               1/2 Avg.Risk  3.4    3.3                                   Avg.Risk  5.0  4.4                                2X Avg.Risk  9.6    7.1                                3X Avg.Risk 23.4   11.0       Assessment & Plan:  Alan Nguyen was seen today for follow-up.  Mixed hyperlipidemia -     Lipid panel  Primary hypertension -     Valsartan -hydroCHLOROthiazide ; Take 1 tablet by mouth daily.  Dispense: 90 tablet; Refill: 0 -     CBC With Diff/Platelet -     Comprehensive metabolic panel with GFR  Morbid obesity with BMI of 40.0-44.9, adult (HCC)  Prediabetes -     Hemoglobin A1c  Benign prostatic hyperplasia without lower urinary tract symptoms -     PSA   Stricter low calorie diet, low cholesterol and low fat diet and exercise as much as possible  Problem List Items Addressed This Visit       Cardiovascular and Mediastinum   Primary hypertension   Relevant  Medications   valsartan -hydrochlorothiazide  (DIOVAN -HCT) 320-12.5 MG tablet   Other Relevant Orders   CBC With Diff/Platelet   Comprehensive metabolic panel with GFR     Other   Prediabetes   Relevant Orders   Hemoglobin A1c   Mixed hyperlipidemia - Primary   Relevant Medications   valsartan -hydrochlorothiazide  (DIOVAN -HCT) 320-12.5 MG tablet   Other Relevant Orders   Lipid panel   Other Visit Diagnoses       Morbid obesity with BMI of 40.0-44.9, adult (HCC)         Benign prostatic hyperplasia without lower urinary tract symptoms       Relevant Orders   PSA       Return in about 4 weeks (around 07/16/2024) for BP followup.   Total time spent: 20 minutes. This time includes review of previous notes and results and patient face to face interaction during today'Binnie Vonderhaar visit.    Alan Cinderella Perry, MD  06/18/2024   This document may have been prepared by [facility] Voice Recognition software and as such may include unintentional dictation errors.      [1] No Known Allergies [2]  Outpatient Medications Prior to Visit  Medication Sig   sildenafil  (VIAGRA ) 100 MG tablet Take 1 tablet (100 mg total) by mouth as needed for erectile dysfunction.   [DISCONTINUED] valsartan -hydrochlorothiazide  (DIOVAN -HCT) 320-12.5 MG tablet Take 1 tablet by mouth daily.   No facility-administered medications prior to visit.   "

## 2024-07-16 ENCOUNTER — Ambulatory Visit: Admitting: Internal Medicine
